# Patient Record
Sex: Male | Born: 1980 | Race: White | Hispanic: No | Marital: Single | Smoking: Former smoker
Health system: Southern US, Community
[De-identification: ages and names within clinical notes are randomized; demographics above are authoritative.]

## PROBLEM LIST (undated history)

## (undated) DIAGNOSIS — K219 Gastro-esophageal reflux disease without esophagitis: Secondary | ICD-10-CM

## (undated) DIAGNOSIS — K279 Peptic ulcer, site unspecified, unspecified as acute or chronic, without hemorrhage or perforation: Secondary | ICD-10-CM

## (undated) DIAGNOSIS — S62619A Displaced fracture of proximal phalanx of unspecified finger, initial encounter for closed fracture: Secondary | ICD-10-CM

## (undated) HISTORY — PX: UPPER GASTROINTESTINAL ENDOSCOPY: SHX188

---

## 2003-11-25 ENCOUNTER — Emergency Department (HOSPITAL_COMMUNITY): Admission: EM | Admit: 2003-11-25 | Discharge: 2003-11-25 | Payer: Self-pay | Admitting: Emergency Medicine

## 2006-03-13 ENCOUNTER — Ambulatory Visit (HOSPITAL_COMMUNITY): Admission: RE | Admit: 2006-03-13 | Discharge: 2006-03-13 | Payer: Self-pay | Admitting: Family Medicine

## 2006-07-15 ENCOUNTER — Emergency Department (HOSPITAL_COMMUNITY): Admission: EM | Admit: 2006-07-15 | Discharge: 2006-07-15 | Payer: Self-pay | Admitting: Emergency Medicine

## 2012-08-27 ENCOUNTER — Emergency Department (HOSPITAL_COMMUNITY)
Admission: EM | Admit: 2012-08-27 | Discharge: 2012-08-27 | Disposition: A | Discharge status: Home / Self Care | Payer: Self-pay | Attending: Emergency Medicine | Admitting: Emergency Medicine

## 2012-08-27 ENCOUNTER — Encounter (HOSPITAL_COMMUNITY): Payer: Self-pay | Admitting: *Deleted

## 2012-08-27 ENCOUNTER — Emergency Department (HOSPITAL_COMMUNITY): Payer: Self-pay

## 2012-08-27 DIAGNOSIS — R110 Nausea: Secondary | ICD-10-CM | POA: Insufficient documentation

## 2012-08-27 DIAGNOSIS — R109 Unspecified abdominal pain: Secondary | ICD-10-CM | POA: Insufficient documentation

## 2012-08-27 DIAGNOSIS — K5900 Constipation, unspecified: Secondary | ICD-10-CM | POA: Insufficient documentation

## 2012-08-27 DIAGNOSIS — R1030 Lower abdominal pain, unspecified: Secondary | ICD-10-CM

## 2012-08-27 DIAGNOSIS — R509 Fever, unspecified: Secondary | ICD-10-CM | POA: Insufficient documentation

## 2012-08-27 HISTORY — DX: Peptic ulcer, site unspecified, unspecified as acute or chronic, without hemorrhage or perforation: K27.9

## 2012-08-27 LAB — URINALYSIS, ROUTINE W REFLEX MICROSCOPIC
Bilirubin Urine: NEGATIVE
Glucose, UA: NEGATIVE mg/dL
Hgb urine dipstick: NEGATIVE
Ketones, ur: NEGATIVE mg/dL
Urobilinogen, UA: 0.2 mg/dL (ref 0.0–1.0)
pH: 6.5 (ref 5.0–8.0)

## 2012-08-27 LAB — CBC: WBC: 7.3 10*3/uL (ref 4.0–10.5)

## 2012-08-27 LAB — COMPREHENSIVE METABOLIC PANEL
Calcium: 9.3 mg/dL (ref 8.4–10.5)
Chloride: 100 mEq/L (ref 96–112)
Creatinine, Ser: 1.12 mg/dL (ref 0.50–1.35)
Sodium: 138 mEq/L (ref 135–145)
Total Bilirubin: 0.3 mg/dL (ref 0.3–1.2)
Total Protein: 6.8 g/dL (ref 6.0–8.3)

## 2012-08-27 MED ORDER — MORPHINE SULFATE 4 MG/ML IJ SOLN
4.0000 mg | Freq: Once | INTRAMUSCULAR | Status: DC
  Filled 2012-08-27: qty 1

## 2012-08-27 MED ORDER — OXYCODONE-ACETAMINOPHEN 5-325 MG PO TABS: 1.0000 | ORAL_TABLET | Freq: Four times a day (QID) | ORAL | Status: AC | PRN

## 2012-08-27 MED ORDER — IOHEXOL 300 MG/ML  SOLN
100.0000 mL | Freq: Once | INTRAMUSCULAR | Status: AC | PRN
  Administered 2012-08-27: 100 mL via INTRAVENOUS

## 2012-08-27 NOTE — ED Provider Notes (Signed)
History   This chart was scribed for Alejandro Chad, MD by [name]. The patient was seen in room [facility]. Patient's care was started at 1425.  CSN: [id number]  Arrival date & time 08/27/12  1425   First MD Initiated Contact with Patient 08/27/12 1638      Chief Complaint  Patient presents with  . Groin Pain   Patient is a 31 y.o. male presenting with groin pain. The history is provided by the patient. No language interpreter was used.  Groin Pain This is a new problem. The current episode started more than 1 week ago. The problem occurs constantly. The problem has not changed since onset.Pertinent negatives include no shortness of breath. The treatment provided no relief.    Alejandro Lawrence is a 31 y.o. male who presents to the Emergency Department complaining of 2 months of chronic groin pain, the result of injury sustained 06/24/12. Injury occured while lifting heavy beams. Pt has been evaluated by groin specialist and diagnosed with a pulled groin. Pain is moderate, worsening, and constant; radiating from groin to coccyx are. Pt endorses associate mild fever, light headedness, mild chills, light constipation, and left testicular "irresponsiveness." Pt denies penile discharge, dysuria, testicular pain, cough, and SOB.  Pt lists groin specialist Dr. Malvin Johns   Past Medical History  Diagnosis Date  . Peptic ulcer     Past Surgical History  Procedure Date  . Upper gastrointestinal endoscopy     History reviewed. No pertinent family history.  History  Substance Use Topics  . Smoking status: Current Some Day Smoker  . Smokeless tobacco: Not on file  . Alcohol Use: Yes   Review of Systems  Constitutional: Positive for fever (mild).  Respiratory: Negative for cough and shortness of breath.   Gastrointestinal: Positive for nausea and constipation (mild). Negative for vomiting.  Genitourinary: Negative for dysuria, hematuria, penile swelling, penile pain and testicular  pain.       +Groin pain  Neurological: Negative for light-headedness.    Allergies  Review of patient's allergies indicates no known allergies.  Home Medications   Current Outpatient Rx  Name Route Sig Dispense Refill  . SULFAMETHOXAZOLE-TMP DS 800-160 MG PO TABS Oral Take 1 tablet by mouth 2 (two) times daily. Started 08/24/12 for 10 days      BP 114/72  Pulse 89  Temp 98.2 F (36.8 C) (Oral)  Resp 18  Ht 5\' 7"  (1.702 m)  Wt 150 lb (68.04 kg)  BMI 23.49 kg/m2  SpO2 99%  Physical Exam  Nursing note and vitals reviewed. Constitutional: He is oriented to person, place, and time. He appears well-developed and well-nourished. No distress.  HENT:  Head: Normocephalic and atraumatic.  Right Ear: External ear normal.  Left Ear: External ear normal.  Nose: Nose normal.  Mouth/Throat: Oropharynx is clear and moist. No oropharyngeal exudate.  Eyes: Conjunctivae normal and EOM are normal. Pupils are equal, round, and reactive to light. Right eye exhibits no discharge. Left eye exhibits no discharge. No scleral icterus.  Neck: Normal range of motion. Neck supple. No JVD present. No tracheal deviation present.  Cardiovascular: Normal rate, regular rhythm, normal heart sounds and intact distal pulses.  Exam reveals no gallop and no friction rub.   No murmur heard. Pulmonary/Chest: Effort normal and breath sounds normal. No stridor. No respiratory distress. He has no wheezes. He has no rales. He exhibits no tenderness.  Abdominal: Soft. Bowel sounds are normal. He exhibits no shifting dullness, no distension, no  pulsatile liver, no fluid wave, no abdominal bruit, no ascites, no pulsatile midline mass and no mass. There is no hepatosplenomegaly, splenomegaly or hepatomegaly. There is tenderness. There is no rigidity, no rebound, no guarding, no CVA tenderness, no tenderness at McBurney's point and negative Murphy's sign. No hernia. Hernia confirmed negative in the ventral area, confirmed  negative in the right inguinal area and confirmed negative in the left inguinal area.    Genitourinary: Penis normal.    Cremasteric reflex is present. Right testis shows no mass, no swelling and no tenderness. Right testis is descended. Cremasteric reflex is not absent on the right side. Left testis shows no mass, no swelling and no tenderness. Left testis is descended. Cremasteric reflex is not absent on the left side. Uncircumcised. No phimosis, paraphimosis, penile erythema or penile tenderness. No discharge found.  Musculoskeletal: Normal range of motion. He exhibits no edema and no tenderness.  Lymphadenopathy:    He has no cervical adenopathy.       Right: No inguinal adenopathy present.       Left: No inguinal adenopathy present.  Neurological: He is alert and oriented to person, place, and time.  Skin: Skin is warm and dry. No rash noted. He is not diaphoretic. No erythema. No pallor.  Psychiatric: He has a normal mood and affect. His behavior is normal.    ED Course  Procedures (including critical care time) DIAGNOSTIC STUDIES: Oxygen Saturation is 100% on room air, normal by my interpretation.    COORDINATION OF CARE: 16:55- Evaluated Pt. Pt is awake,alert, oriented, and without distress.    Labs Reviewed  COMPREHENSIVE METABOLIC PANEL - Abnormal; Notable for the following:    Glucose, Bld 105 (*)     GFR calc non Af Amer 87 (*)     All other components within normal limits  URINALYSIS, ROUTINE W REFLEX MICROSCOPIC - Abnormal; Notable for the following:    Specific Gravity, Urine <1.005 (*)     All other components within normal limits  CBC   Ct Abdomen Pelvis W Contrast  08/27/2012  *RADIOLOGY REPORT*  Clinical Data: 31 year old male with left-sided abdominal, pelvic and inguinal pain.  CT ABDOMEN AND PELVIS WITH CONTRAST  Technique:  Multidetector CT imaging of the abdomen and pelvis was performed following the standard protocol during bolus administration of  intravenous contrast.  Contrast: OMNIPAQUE IOHEXOL 300 MG/ML  SOLN  Comparison: None  Findings: The liver, spleen, adrenal glands, kidneys, gallbladder and pancreas are unremarkable.  No free fluid, enlarged lymph nodes, biliary dilation or abdominal aortic aneurysm identified. The bowel and bladder are unremarkable. There is the suggestion of a very small left inguinal hernia containing fat. There is no evidence of right inguinal hernia.  There is no evidence of femoral or ventral hernia.  No acute or suspicious bony abnormalities are identified.  IMPRESSION: Possible very small left inguinal hernia containing fat - otherwise unremarkable CT of the abdomen and pelvis with contrast.   Original Report Authenticated By: Rosendo Gros, M.D.      No diagnosis found.    MDM  Pt presents for evaluation of pain along his left inguinal crease.  He appears nontoxic, NAD.  There is reproducible tenderness but no mass or palpable hernia.  This is a lingering injury since 7/11 that did not resolve after 3 wks rest and antiinflammatories.  He also reports a sluggish cremasteric reflex on the left.  Will obtain basic labs, u/a, and a CT scan to evaluate for a  possible sliding hernia or incarcerated hernia.  Pt has no testicular tenderness or masses.  No evidence of epididymitis on exam.  2100.  Pt stable, NAD.  CT scan negative for an incarcerated hernia but a "possible" small fat containing hernia is noted.  Note nl U/A and belly labs.  Plan follow-up closely with a general surgeon.  Discussed indications for immediate return to the emergency department.      I personally performed the services described in this documentation, which was scribed in my presence. The recorded information has been reviewed and considered.       Alejandro Chad, MD 08/27/12 2138

## 2012-08-27 NOTE — ED Notes (Signed)
Pain lt side of groin , being treated for Urgent Care. And seen by surgeon.2 days ago.

## 2012-08-27 NOTE — ED Notes (Signed)
Pt back from CT

## 2012-08-27 NOTE — ED Notes (Signed)
Pt reporting overall pain level tolerable, still not wanting pain medication.  Denies nausea or vomiting.  IV patent.  Pt denies needs at present time.

## 2012-08-31 NOTE — Consult Note (Signed)
[name], PENDRY NO.:  [id number]  MEDICAL RECORD NO.:  [id number]  LOCATION:  [facility]                         FACILITY:  [facility]  PHYSICIAN:  Barbaraann Barthel, M.D. DATE OF BIRTH:  03-28-1981  DATE OF CONSULTATION:  08/31/2012 DATE OF DISCHARGE:  08/27/2012                                CONSULTATION   DIAGNOSIS:  Left inguinal hernia.  NOTE:  This is a 31 year old white male who was sent to me by Dr. Wende Crease for left groin pain.  I initially thought that this was a groin strain as I clinically did not find a hernia on him.  I did have arrangements to follow up with him to make sure that I had not missed anything on my clinical examination.  Apparently in the mean time, he still had some discomfort in this area and went to the emergency room where CT scan revealed what was possibly a very small left inguinal hernia containing fat.  I reviewed the CT scans with Dr. Tyron Russell.  We did not find any evidence of any bowel within this.  However this could be a piece of fat that is causing him his discomfort through a small hernia site in his left groin.  We made plans for elective evaluation and surgery and repair of groin after exploration of left groin.  We discussed complications not limited to, but including bleeding, infection, and recurrence.  Informed consent was obtained.  Past medical history is included a history of peptic ulcer disease and reflux.  He had an upper GI endoscopy which in the past for this problem.  He had been taking some Advil and some Bactrim.  I advised him to not take Advil with his past history of ulcer disease.  He took Bactrim for dysuria and some epididymitis which he had in the past.  He has no known allergies.  He does not smoke or drink.  PHYSICAL EXAMINATION:  VITAL SIGNS:  He is 5 feet 7 inches, weighs 150 pounds, his temperature is 98.5 pulse rate is 80, respirations 12, blood pressure 110/80. HEENT:  Head is normocephalic.   Eyes, extraocular movements are intact. Pupils were round and reactive to light and accommodation.  There is no conjunctive pallor or scleral injection.  The sclera is a normal tincture.  Oral and nasal mucosa is moist. NECK:  Supple and cylindrical without jugular vein distention, thyromegaly or tracheal deviation.  No bruits are appreciated and there is no cervical adenopathy and no thyromegaly. CHEST:  Clear both the anterior and posterior auscultation. HEART:  Regular rhythm. ABDOMEN:  The patient has pain in his left groin, inguinal canal. Again, I do not palpate any real masses in this area, although there is a certain area of tenderness that is found on physical exam. RECTAL:  The stool is guaiac-negative. EXTREMITIES:  Within normal limits, however the patient has had a past history of a fracture to his left arm and he has also had a left shoulder dislocation in the past and he has also had a left ankle fracture in the past.  All of these on the left side as well as is left inguinal hernia.  REVIEW OF  SYSTEMS:  NEURO:  No lateralizing neurological findings.  No history of migraines or seizures.  ENDOCRINE:  No history of diabetes or thyroid disease.  CARDIOPULMONARY  Grossly within normal limits. MUSCULOSKELETAL:  Previous history of fractures of his left upper extremity and left lower extremity as mentioned above.  GI:  No history of nausea or vomiting.  No history of hepatitis, constipation, diarrhea or bright red rectal bleeding.  He did have some bright red rectal bleeding in the past when he was treated for his stomach ulcers.  No history of melena.  No history of inflammatory bowel disease or unexplained weight loss.  GU:  No history of kidney stones.  He did have some testicular discomfort.  He was treated for epididymitis recently. He has no dysuria and no frequency.  Review of history and physical, therefore, Alejandro Lawrence is a 31 year old male who was thought to have a  groin strain, but apparently on CT scan has a left inguinal hernia.  We will plan to explore this via the outpatient department.  We planned for elective outpatient surgery.     Barbaraann Barthel, M.D.     WB/MEDQ  D:  08/31/2012  T:  08/31/2012  Job:  [id number]  cc:   Laverle Hobby, M.D. [address] [place], [place] [address]

## 2012-09-01 ENCOUNTER — Encounter (HOSPITAL_COMMUNITY): Admission: RE | Admit: 2012-09-01 | Discharge: 2012-09-01 | Payer: Worker's Compensation | Source: Ambulatory Visit

## 2012-09-01 NOTE — Patient Instructions (Addendum)
21 Alejandro Lawrence  09/01/2012   Your procedure is scheduled on:  09/06/12  Report to [facility] at 06:15 AM.  Call this number if you have problems the morning of surgery: [phone]   Remember:   Do not eat food or drink:After Midnight.  Clear liquids include soda, tea, black coffee, apple or grape juice, broth.  Take these medicines the morning of surgery with A SIP OF WATER: None   Do not wear jewelry, make-up or nail polish.  Do not wear lotions, powders, or perfumes.   Do not shave 48 hours prior to surgery. Men may shave face and neck.  Do not bring valuables to the hospital.  Contacts, dentures or bridgework may not be worn into surgery.  Leave suitcase in the car. After surgery it may be brought to your room.  For patients admitted to the hospital, checkout time is 11:00 AM the day of discharge.   Patients discharged the day of surgery will not be allowed to drive home.  Special Instructions: CHG Shower Shower 2 days before surgery and 1 day before surgery with Hibiclens.   Please read over the following fact sheets that you were given: Pain Booklet, MRSA Information, Surgical Site Infection Prevention, Anesthesia Post-op Instructions and Care and Recovery After Surgery    Hernia, Surgical Repair A hernia occurs when an internal organ pushes out through a weak spot in the belly (abdominal) wall muscles. Hernias commonly occur in the groin and around the navel. Hernias often can be pushed back into place (reduced). Most hernias tend to get worse over time. Problems occur when abdominal contents get stuck in the opening (incarcerated hernia). The blood supply gets cut off (strangulated hernia). This is an emergency and needs surgery. Otherwise, hernia repair can be an elective procedure. This means you can schedule this at your convenience when an emergency is not present. Because complications can occur, if you decide to repair the hernia, it is best to do it soon. When it becomes an  emergency procedure, there is increased risk of complications after surgery. CAUSES   Heavy lifting.   Obesity.   Prolonged coughing.   Straining to move your bowels.   Hernias can also occur through a cut (incision) by a surgeonafter an abdominal operation.  HOME CARE INSTRUCTIONS Before the repair:  Bed rest is not required. You may continue your normal activities, but avoid heavy lifting (more than 10 pounds) or straining. Cough gently. If you are a smoker, it is best to stop. Even the best hernia repair can break down with the continual strain of coughing.   Do not wear anything tight over your hernia. Do not try to keep it in with an outside bandage or truss. These can damage abdominal contents if they are trapped in the hernia sac.   Eat a normal diet. Avoid constipation. Straining over long periods of time to have a bowel movement will increase hernia size. It also can breakdown repairs. If you cannot do this with diet alone, laxatives or stool softeners may be used.  PRIOR TO SURGERY, SEEK IMMEDIATE MEDICAL CARE IF: You have problems (symptoms) of a trapped (incarcerated) hernia. Symptoms include:  An oral temperature above 102 F (38.9 C) develops, or as your caregiver suggests.   Increasing abdominal pain.   Feeling sick to your stomach(nausea) and vomiting.   You stop passing gas or stool.   The hernia is stuck outside the abdomen, looks discolored, feels hard, or is tender.  You have any changes in your bowel habits or in the hernia that is unusual for you.  LET YOUR CAREGIVERS KNOW ABOUT THE FOLLOWING:  Allergies.   Medications taken including herbs, eye drops, over the counter medications, and creams.   Use of steroids (by mouth or creams).   Family or personal history of problems with anesthetics or Novocaine.   Possibility of pregnancy, if this applies.   Personal history of blood clots (thrombophlebitis).   Family or personal history of bleeding or  blood problems.   Previous surgery.   Other health problems.  BEFORE THE PROCEDURE You should be present 1 hour prior to your procedure, or as directed by your caregiver.  AFTER THE PROCEDURE After surgery, you will be taken to the recovery area. A nurse will watch and check your progress there. Once you are awake, stable, and taking fluids well, you will be allowed to go home as long as there are no problems. Once home, an ice pack (wrapped in a light towel) applied to your operative site may help with discomfort. It may also keep the swelling down. Do not lift anything heavier than 10 pounds (4.55 kilograms). Take showers not baths. Do not drive while taking narcotics. Follow instructions as suggested by your caregiver.  SEEK IMMEDIATE MEDICAL CARE IF: After surgery:  There is redness, swelling, or increasing pain in the wound.   There is pus coming from the wound.   There is drainage from a wound lasting longer than 1 day.   An unexplained oral temperature above 102 F (38.9 C) develops.   You notice a foul smell coming from the wound or dressing.   There is a breaking open of a wound (edged not staying together) after the sutures have been removed.   You notice increasing pain in the shoulders (shoulder strap areas).   You develop dizzy episodes or fainting while standing.   You develop persistent nausea or vomiting.   You develop a rash.   You have difficulty breathing.   You develop any reaction or side effects to medications given.  MAKE SURE YOU:   Understand these instructions.   Will watch your condition.   Will get help right away if you are not doing well or get worse.  Document Released: 05/27/2001 Document Revised: 11/20/2011 Document Reviewed: 04/18/2008 [facility] Patient Information 2012 [place], [place].   PATIENT INSTRUCTIONS POST-ANESTHESIA  IMMEDIATELY FOLLOWING SURGERY:  Do not drive or operate machinery for the first twenty four hours after  surgery.  Do not make any important decisions for twenty four hours after surgery or while taking narcotic pain medications or sedatives.  If you develop intractable nausea and vomiting or a severe headache please notify your doctor immediately.  FOLLOW-UP:  Please make an appointment with your surgeon as instructed. You do not need to follow up with anesthesia unless specifically instructed to do so.  WOUND CARE INSTRUCTIONS (if applicable):  Keep a dry clean dressing on the anesthesia/puncture wound site if there is drainage.  Once the wound has quit draining you may leave it open to air.  Generally you should leave the bandage intact for twenty four hours unless there is drainage.  If the epidural site drains for more than 36-48 hours please call the anesthesia department.  QUESTIONS?:  Please feel free to call your physician or the hospital operator if you have any questions, and they will be happy to assist you.

## 2012-09-02 NOTE — Patient Instructions (Addendum)
20 JAMARIOUS FEBO  09/02/2012   Your procedure is scheduled on:  09/06/2012  Report to [facility] at  615  AM.  Call this number if you have problems the morning of surgery: [phone]   Remember:   Do not eat food:After Midnight.  May have clear liquids:until Midnight .    Take these medicines the morning of surgery with A SIP OF WATER: oxycodone   Do not wear jewelry, make-up or nail polish.   Do not shave 48 hours prior to surgery. Men may shave face and neck.  Do not bring valuables to the hospital.  Contacts, dentures or bridgework may not be worn into surgery.  Leave suitcase in the car. After surgery it may be brought to your room.  For patients admitted to the hospital, checkout time is 11:00 AM the day of discharge.   Patients discharged the day of surgery will not be allowed to drive home.  Name and phone number of your driver: family  Special Instructions: Shower using CHG 2 nights before surgery and the night before surgery.  If you shower the day of surgery use CHG.  Use special wash - you have one bottle of CHG for all showers.  You should use approximately 1/3 of the bottle for each shower.   Please read over the following fact sheets that you were given: Pain Booklet, MRSA Information, Surgical Site Infection Prevention, Anesthesia Post-op Instructions and Care and Recovery After Surgery Inguinal Hernia, Adult Muscles help keep everything in the body in its proper place. But if a weak spot in the muscles develops, something can poke through. That is called a hernia. When this happens in the lower part of the belly (abdomen), it is called an inguinal hernia. (It takes its name from a part of the body in this region called the inguinal canal.) A weak spot in the wall of muscles lets some fat or part of the small intestine bulge through. An inguinal hernia can develop at any age. Men get them more often than women. CAUSES  In adults, an inguinal hernia develops over  time.  It can be triggered by:   Suddenly straining the muscles of the lower abdomen.   Lifting heavy objects.   Straining to have a bowel movement. Difficult bowel movements (constipation) can lead to this.   Constant coughing. This may be caused by smoking or lung disease.   Being overweight.   Being pregnant.   Working at a job that requires long periods of standing or heavy lifting.   Having had an inguinal hernia before.  One type can be an emergency situation. It is called a strangulated inguinal hernia. It develops if part of the small intestine slips through the weak spot and cannot get back into the abdomen. The blood supply can be cut off. If that happens, part of the intestine may die. This situation requires emergency surgery. SYMPTOMS  Often, a small inguinal hernia has no symptoms. It is found when a healthcare provider does a physical exam. Larger hernias usually have symptoms.   In adults, symptoms may include:   A lump in the groin. This is easier to see when the person is standing. It might disappear when lying down.   In men, a lump in the scrotum.   Pain or burning in the groin. This occurs especially when lifting, straining or coughing.   A dull ache or feeling of pressure in the groin.   Signs of  a strangulated hernia can include:   A bulge in the groin that becomes very painful and tender to the touch.   A bulge that turns red or purple.   Fever, nausea and vomiting.   Inability to have a bowel movement or to pass gas.  DIAGNOSIS  To decide if you have an inguinal hernia, a healthcare provider will probably do a physical examination.  This will include asking questions about any symptoms you have noticed.   The healthcare provider might feel the groin area and ask you to cough. If an inguinal hernia is felt, the healthcare provider may try to slide it back into the abdomen.   Usually no other tests are needed.  TREATMENT  Treatments can vary.  The size of the hernia makes a difference. Options include:  Watchful waiting. This is often suggested if the hernia is small and you have had no symptoms.   No medical procedure will be done unless symptoms develop.   You will need to watch closely for symptoms. If any occur, contact your healthcare provider right away.   Surgery. This is used if the hernia is larger or you have symptoms.   Open surgery. This is usually an outpatient procedure (you will not stay overnight in a hospital). An cut (incision) is made through the skin in the groin. The hernia is put back inside the abdomen. The weak area in the muscles is then repaired by herniorrhaphy or hernioplasty. Herniorrhaphy: in this type of surgery, the weak muscles are sewn back together. Hernioplasty: a patch or mesh is used to close the weak area in the abdominal wall.   Laparoscopy. In this procedure, a surgeon makes small incisions. A thin tube with a tiny video camera (called a laparoscope) is put into the abdomen. The surgeon repairs the hernia with mesh by looking with the video camera and using two long instruments.  HOME CARE INSTRUCTIONS   After surgery to repair an inguinal hernia:   You will need to take pain medicine prescribed by your healthcare provider. Follow all directions carefully.   You will need to take care of the wound from the incision.   Your activity will be restricted for awhile. This will probably include no heavy lifting for several weeks. You also should not do anything too active for a few weeks. When you can return to work will depend on the type of job that you have.   During "watchful waiting" periods, you should:   Maintain a healthy weight.   Eat a diet high in fiber (fruits, vegetables and whole grains).   Drink plenty of fluids to avoid constipation. This means drinking enough water and other liquids to keep your urine clear or pale yellow.   Do not lift heavy objects.   Do not stand for  long periods of time.   Quit smoking. This should keep you from developing a frequent cough.  SEEK MEDICAL CARE IF:   A bulge develops in your groin area.   You feel pain, a burning sensation or pressure in the groin. This might be worse if you are lifting or straining.   You develop a fever of more than 100.5 F (38.1 C).  SEEK IMMEDIATE MEDICAL CARE IF:   Pain in the groin increases suddenly.   A bulge in the groin gets bigger suddenly and does not go down.   For men, there is sudden pain in the scrotum. Or, the size of the scrotum increases.   A  bulge in the groin area becomes red or purple and is painful to touch.   You have nausea or vomiting that does not go away.   You feel your heart beating much faster than normal.   You cannot have a bowel movement or pass gas.   You develop a fever of more than 102.0 F (38.9 C).  Document Released: 04/19/2009 Document Revised: 11/20/2011 Document Reviewed: 04/19/2009 [facility] Patient Information 2012 [place], [place].PATIENT INSTRUCTIONS POST-ANESTHESIA  IMMEDIATELY FOLLOWING SURGERY:  Do not drive or operate machinery for the first twenty four hours after surgery.  Do not make any important decisions for twenty four hours after surgery or while taking narcotic pain medications or sedatives.  If you develop intractable nausea and vomiting or a severe headache please notify your doctor immediately.  FOLLOW-UP:  Please make an appointment with your surgeon as instructed. You do not need to follow up with anesthesia unless specifically instructed to do so.  WOUND CARE INSTRUCTIONS (if applicable):  Keep a dry clean dressing on the anesthesia/puncture wound site if there is drainage.  Once the wound has quit draining you may leave it open to air.  Generally you should leave the bandage intact for twenty four hours unless there is drainage.  If the epidural site drains for more than 36-48 hours please call the anesthesia  department.  QUESTIONS?:  Please feel free to call your physician or the hospital operator if you have any questions, and they will be happy to assist you.

## 2012-09-03 ENCOUNTER — Encounter (HOSPITAL_COMMUNITY): Payer: Self-pay | Admitting: Pharmacy Technician

## 2012-09-03 ENCOUNTER — Encounter (HOSPITAL_COMMUNITY): Payer: Self-pay

## 2012-09-03 ENCOUNTER — Encounter (HOSPITAL_COMMUNITY)
Admission: RE | Admit: 2012-09-03 | Discharge: 2012-09-03 | Disposition: A | Discharge status: Home / Self Care | Payer: Worker's Compensation | Source: Ambulatory Visit | Attending: General Surgery | Admitting: General Surgery

## 2012-09-03 LAB — SURGICAL PCR SCREEN: Staphylococcus aureus: NEGATIVE

## 2012-09-06 ENCOUNTER — Ambulatory Visit (HOSPITAL_COMMUNITY): Payer: Worker's Compensation | Admitting: Anesthesiology

## 2012-09-06 ENCOUNTER — Encounter (HOSPITAL_COMMUNITY)
Admission: RE | Disposition: A | Discharge status: Home / Self Care | Payer: Self-pay | Source: Ambulatory Visit | Attending: General Surgery

## 2012-09-06 ENCOUNTER — Ambulatory Visit (HOSPITAL_COMMUNITY)
Admission: RE | Admit: 2012-09-06 | Discharge: 2012-09-07 | Disposition: A | Discharge status: Home / Self Care | Payer: Worker's Compensation | Source: Ambulatory Visit | Attending: General Surgery | Admitting: General Surgery

## 2012-09-06 ENCOUNTER — Encounter (HOSPITAL_COMMUNITY): Payer: Self-pay | Admitting: Anesthesiology

## 2012-09-06 ENCOUNTER — Encounter (HOSPITAL_COMMUNITY): Payer: Self-pay

## 2012-09-06 DIAGNOSIS — Z01812 Encounter for preprocedural laboratory examination: Secondary | ICD-10-CM | POA: Insufficient documentation

## 2012-09-06 DIAGNOSIS — Z01810 Encounter for preprocedural cardiovascular examination: Secondary | ICD-10-CM | POA: Insufficient documentation

## 2012-09-06 DIAGNOSIS — K4090 Unilateral inguinal hernia, without obstruction or gangrene, not specified as recurrent: Secondary | ICD-10-CM

## 2012-09-06 HISTORY — PX: INGUINAL HERNIA REPAIR: SHX194

## 2012-09-06 SURGERY — REPAIR, HERNIA, INGUINAL, ADULT
Anesthesia: General | Site: Abdomen | Laterality: Left | Wound class: Clean

## 2012-09-06 MED ORDER — CEFAZOLIN SODIUM-DEXTROSE 2-3 GM-% IV SOLR
INTRAVENOUS | Status: AC
  Filled 2012-09-06: qty 50

## 2012-09-06 MED ORDER — LIDOCAINE HCL 1 % IJ SOLN
INTRAMUSCULAR | Status: DC | PRN
  Administered 2012-09-06: 50 mg via INTRADERMAL

## 2012-09-06 MED ORDER — ROCURONIUM BROMIDE 100 MG/10ML IV SOLN
INTRAVENOUS | Status: DC | PRN
  Administered 2012-09-06: 30 mg via INTRAVENOUS
  Administered 2012-09-06: 10 mg via INTRAVENOUS

## 2012-09-06 MED ORDER — SODIUM CHLORIDE 0.9 % IR SOLN
Status: DC | PRN
  Administered 2012-09-06: 1000 mL

## 2012-09-06 MED ORDER — ONDANSETRON HCL 4 MG/2ML IJ SOLN: 4.0000 mg | Freq: Once | INTRAMUSCULAR | Status: DC | PRN

## 2012-09-06 MED ORDER — NEOSTIGMINE METHYLSULFATE 1 MG/ML IJ SOLN
INTRAMUSCULAR | Status: AC
  Filled 2012-09-06: qty 10

## 2012-09-06 MED ORDER — FENTANYL CITRATE 0.05 MG/ML IJ SOLN
INTRAMUSCULAR | Status: DC | PRN
  Administered 2012-09-06 (×2): 50 ug via INTRAVENOUS
  Administered 2012-09-06: 100 ug via INTRAVENOUS
  Administered 2012-09-06: 50 ug via INTRAVENOUS

## 2012-09-06 MED ORDER — CEFAZOLIN SODIUM-DEXTROSE 2-3 GM-% IV SOLR: 2.0000 g | INTRAVENOUS | Status: DC

## 2012-09-06 MED ORDER — MIDAZOLAM HCL 5 MG/5ML IJ SOLN
INTRAMUSCULAR | Status: DC | PRN
  Administered 2012-09-06: 2 mg via INTRAVENOUS

## 2012-09-06 MED ORDER — MIDAZOLAM HCL 2 MG/2ML IJ SOLN
INTRAMUSCULAR | Status: AC
  Filled 2012-09-06: qty 2

## 2012-09-06 MED ORDER — SUCCINYLCHOLINE CHLORIDE 20 MG/ML IJ SOLN
INTRAMUSCULAR | Status: DC | PRN
  Administered 2012-09-06: 150 mg via INTRAVENOUS

## 2012-09-06 MED ORDER — BACITRACIN 50000 UNITS IM SOLR
INTRAMUSCULAR | Status: AC
  Filled 2012-09-06: qty 1

## 2012-09-06 MED ORDER — LORAZEPAM 0.5 MG PO TABS
0.5000 mg | ORAL_TABLET | Freq: Every day | ORAL | Status: DC
  Administered 2012-09-06: 0.5 mg via ORAL
  Filled 2012-09-06 (×2): qty 1

## 2012-09-06 MED ORDER — FENTANYL CITRATE 0.05 MG/ML IJ SOLN
INTRAMUSCULAR | Status: AC
  Filled 2012-09-06: qty 2

## 2012-09-06 MED ORDER — ONDANSETRON HCL 4 MG/2ML IJ SOLN
4.0000 mg | Freq: Four times a day (QID) | INTRAMUSCULAR | Status: DC | PRN
  Administered 2012-09-06 – 2012-09-07 (×2): 4 mg via INTRAVENOUS
  Filled 2012-09-06 (×3): qty 2

## 2012-09-06 MED ORDER — PROPOFOL 10 MG/ML IV BOLUS
INTRAVENOUS | Status: DC | PRN
  Administered 2012-09-06: 160 mg via INTRAVENOUS

## 2012-09-06 MED ORDER — GLYCOPYRROLATE 0.2 MG/ML IJ SOLN
INTRAMUSCULAR | Status: AC
  Filled 2012-09-06: qty 2

## 2012-09-06 MED ORDER — DOCUSATE SODIUM 100 MG PO CAPS
100.0000 mg | ORAL_CAPSULE | Freq: Every day | ORAL | Status: DC
  Administered 2012-09-06 – 2012-09-07 (×2): 100 mg via ORAL
  Filled 2012-09-06 (×2): qty 1

## 2012-09-06 MED ORDER — FAMOTIDINE IN NACL 20-0.9 MG/50ML-% IV SOLN
20.0000 mg | Freq: Two times a day (BID) | INTRAVENOUS | Status: DC
  Administered 2012-09-06 – 2012-09-07 (×3): 20 mg via INTRAVENOUS
  Filled 2012-09-06 (×5): qty 50

## 2012-09-06 MED ORDER — HYDROCODONE-ACETAMINOPHEN 5-325 MG PO TABS
1.0000 | ORAL_TABLET | ORAL | Status: DC | PRN
  Administered 2012-09-06 (×4): 1 via ORAL
  Administered 2012-09-07 (×2): 2 via ORAL
  Filled 2012-09-06: qty 2
  Filled 2012-09-06: qty 1
  Filled 2012-09-06: qty 2
  Filled 2012-09-06: qty 1
  Filled 2012-09-06: qty 2

## 2012-09-06 MED ORDER — GLYCOPYRROLATE 0.2 MG/ML IJ SOLN
INTRAMUSCULAR | Status: DC | PRN
  Administered 2012-09-06: 0.4 mg via INTRAVENOUS

## 2012-09-06 MED ORDER — POTASSIUM CHLORIDE IN NACL 20-0.9 MEQ/L-% IV SOLN
INTRAVENOUS | Status: DC
  Administered 2012-09-06 – 2012-09-07 (×2): via INTRAVENOUS
  Administered 2012-09-07: 1000 mL via INTRAVENOUS
  Filled 2012-09-06 (×4): qty 1000

## 2012-09-06 MED ORDER — CEFAZOLIN SODIUM-DEXTROSE 2-3 GM-% IV SOLR
INTRAVENOUS | Status: DC | PRN
  Administered 2012-09-06: 2 g via INTRAVENOUS

## 2012-09-06 MED ORDER — MIDAZOLAM HCL 2 MG/2ML IJ SOLN
1.0000 mg | INTRAMUSCULAR | Status: DC | PRN
  Administered 2012-09-06: 2 mg via INTRAVENOUS

## 2012-09-06 MED ORDER — MORPHINE SULFATE 4 MG/ML IJ SOLN
1.0000 mg | INTRAMUSCULAR | Status: DC | PRN
  Administered 2012-09-06: 1 mg via INTRAVENOUS
  Filled 2012-09-06: qty 1

## 2012-09-06 MED ORDER — NEOSTIGMINE METHYLSULFATE 1 MG/ML IJ SOLN
INTRAMUSCULAR | Status: DC | PRN
  Administered 2012-09-06: 3 mg via INTRAVENOUS

## 2012-09-06 MED ORDER — LIDOCAINE HCL (PF) 1 % IJ SOLN
INTRAMUSCULAR | Status: AC
  Filled 2012-09-06: qty 5

## 2012-09-06 MED ORDER — PROPOFOL 10 MG/ML IV EMUL
INTRAVENOUS | Status: AC
  Filled 2012-09-06: qty 20

## 2012-09-06 MED ORDER — BUPIVACAINE HCL (PF) 0.5 % IJ SOLN
INTRAMUSCULAR | Status: DC | PRN
  Administered 2012-09-06: 18 mL

## 2012-09-06 MED ORDER — FENTANYL CITRATE 0.05 MG/ML IJ SOLN
25.0000 ug | INTRAMUSCULAR | Status: DC | PRN
  Administered 2012-09-06 (×3): 50 ug via INTRAVENOUS

## 2012-09-06 MED ORDER — SUCCINYLCHOLINE CHLORIDE 20 MG/ML IJ SOLN
INTRAMUSCULAR | Status: AC
  Filled 2012-09-06: qty 1

## 2012-09-06 MED ORDER — ONDANSETRON HCL 4 MG PO TABS
4.0000 mg | ORAL_TABLET | Freq: Four times a day (QID) | ORAL | Status: DC | PRN
  Administered 2012-09-06: 4 mg via ORAL
  Filled 2012-09-06: qty 1

## 2012-09-06 MED ORDER — FENTANYL CITRATE 0.05 MG/ML IJ SOLN
INTRAMUSCULAR | Status: AC
  Filled 2012-09-06: qty 5

## 2012-09-06 MED ORDER — ONDANSETRON HCL 4 MG/2ML IJ SOLN
4.0000 mg | Freq: Once | INTRAMUSCULAR | Status: AC
  Administered 2012-09-06: 4 mg via INTRAVENOUS

## 2012-09-06 MED ORDER — ACETAMINOPHEN 650 MG RE SUPP: 650.0000 mg | RECTAL | Status: DC | PRN

## 2012-09-06 MED ORDER — ROCURONIUM BROMIDE 50 MG/5ML IV SOLN
INTRAVENOUS | Status: AC
  Filled 2012-09-06: qty 1

## 2012-09-06 MED ORDER — LACTATED RINGERS IV SOLN
INTRAVENOUS | Status: DC
  Administered 2012-09-06: 1000 mL via INTRAVENOUS
  Administered 2012-09-06: 07:00:00 via INTRAVENOUS

## 2012-09-06 MED ORDER — BUPIVACAINE HCL (PF) 0.5 % IJ SOLN
INTRAMUSCULAR | Status: AC
  Filled 2012-09-06: qty 30

## 2012-09-06 MED ORDER — ONDANSETRON HCL 4 MG/2ML IJ SOLN
INTRAMUSCULAR | Status: AC
  Filled 2012-09-06: qty 2

## 2012-09-06 SURGICAL SUPPLY — 47 items
ATTRACTOMAT 16X20 MAGNETIC DRP (DRAPES) ×2 IMPLANT
BAG HAMPER (MISCELLANEOUS) ×2 IMPLANT
CLOTH BEACON ORANGE TIMEOUT ST (SAFETY) ×2 IMPLANT
COVER LIGHT HANDLE STERIS (MISCELLANEOUS) ×4 IMPLANT
DECANTER SPIKE VIAL GLASS SM (MISCELLANEOUS) ×2 IMPLANT
DRAIN PENROSE 12X.25 LTX STRL (MISCELLANEOUS) ×2 IMPLANT
DRSG MEPILEX BORDER 4X8 (GAUZE/BANDAGES/DRESSINGS) IMPLANT
ELECT REM PT RETURN 9FT ADLT (ELECTROSURGICAL) ×2
ELECTRODE REM PT RTRN 9FT ADLT (ELECTROSURGICAL) ×1 IMPLANT
FORMALIN 10 PREFIL 120ML (MISCELLANEOUS) ×2 IMPLANT
GLOVE BIOGEL PI IND STRL 7.0 (GLOVE) IMPLANT
GLOVE BIOGEL PI INDICATOR 7.0 (GLOVE) ×2
GLOVE EXAM NITRILE MD LF STRL (GLOVE) ×1 IMPLANT
GLOVE SKINSENSE NS SZ7.0 (GLOVE) ×1
GLOVE SKINSENSE STRL SZ7.0 (GLOVE) ×1 IMPLANT
GLOVE SS BIOGEL STRL SZ 6.5 (GLOVE) IMPLANT
GLOVE SUPERSENSE BIOGEL SZ 6.5 (GLOVE) ×1
GOWN STRL REIN XL XLG (GOWN DISPOSABLE) ×6 IMPLANT
INST SET MINOR GENERAL (KITS) ×2 IMPLANT
KIT ROOM TURNOVER APOR (KITS) ×2 IMPLANT
MANIFOLD NEPTUNE II (INSTRUMENTS) ×2 IMPLANT
NDL HYPO 25X1 1.5 SAFETY (NEEDLE) ×1 IMPLANT
NEEDLE HYPO 25X1 1.5 SAFETY (NEEDLE) ×2 IMPLANT
NS IRRIG 1000ML POUR BTL (IV SOLUTION) ×2 IMPLANT
PACK MINOR (CUSTOM PROCEDURE TRAY) ×2 IMPLANT
PAD ARMBOARD 7.5X6 YLW CONV (MISCELLANEOUS) ×2 IMPLANT
SET BASIN LINEN APH (SET/KITS/TRAYS/PACK) ×2 IMPLANT
SOL PREP PROV IODINE SCRUB 4OZ (MISCELLANEOUS) ×2 IMPLANT
SPONGE GAUZE 4X4 12PLY (GAUZE/BANDAGES/DRESSINGS) ×2 IMPLANT
SPONGE INTESTINAL PEANUT (DISPOSABLE) ×2 IMPLANT
SPONGE LAP 18X18 X RAY DECT (DISPOSABLE) ×2 IMPLANT
STAPLER VISISTAT 35W (STAPLE) ×2 IMPLANT
SUT NOVA NAB GS-22 2 2-0 T-19 (SUTURE) IMPLANT
SUT NUROLON NAB CT 2 2-0 18IN (SUTURE) ×1 IMPLANT
SUT PROLENE 0 CT 1 CR/8 (SUTURE) IMPLANT
SUT SILK 2 0 (SUTURE) ×2
SUT SILK 2-0 18XBRD TIE 12 (SUTURE) ×1 IMPLANT
SUT VIC AB 0 CT1 27 (SUTURE) ×2
SUT VIC AB 0 CT1 27XBRD ANTBC (SUTURE) ×1 IMPLANT
SUT VIC AB 3-0 SH 27 (SUTURE) ×2
SUT VIC AB 3-0 SH 27X BRD (SUTURE) ×1 IMPLANT
SUT VICRYL AB 3 0 TIES (SUTURE) ×2 IMPLANT
SYR BULB IRRIGATION 50ML (SYRINGE) ×2 IMPLANT
SYR CONTROL 10ML LL (SYRINGE) ×2 IMPLANT
TAPE CLOTH SURG 4X10 WHT LF (GAUZE/BANDAGES/DRESSINGS) ×1 IMPLANT
TRAY FOLEY CATH 14FR (SET/KITS/TRAYS/PACK) ×2 IMPLANT
WATER STERILE IRR 1000ML POUR (IV SOLUTION) ×4 IMPLANT

## 2012-09-06 NOTE — Progress Notes (Signed)
UR Chart Review Completed-- OIB 

## 2012-09-06 NOTE — Progress Notes (Signed)
Pt c/o nausea intermittently, gave pt cotton ball with lavender oil to help with nausea.  Pt agreed to use aromatherapy to try and help his nausea.

## 2012-09-06 NOTE — Op Note (Signed)
Alejandro Lawrence, Alejandro Lawrence NO.:  [id number]  MEDICAL RECORD NO.:  [id number]  LOCATION:  A211                          FACILITY:  [facility]  PHYSICIAN:  Barbaraann Barthel, M.D. DATE OF BIRTH:  [birth year]  DATE OF PROCEDURE:  09/06/2012 DATE OF DISCHARGE:                              OPERATIVE REPORT   PREOPERATIVE DIAGNOSIS:  Left inguinal hernia.  POSTOPERATIVE DIAGNOSIS:  Left inguinal hernia.  PROCEDURE:  Left inguinal herniorrhaphy (modified McVay repair without mesh).  SPECIMENS:  Lipoma of the cord (properitoneal fat).  WOUND CLASSIFICATION:  Clean.  NOTE:  This is a 31 year old white male who in July strained while lifting heavy objects at work and had a severe left groin pain.  He was seen by Dr. Wende Crease and then later by me and we both thought that he had a left inguinal groin strain.  He also had some symptoms of epididymitis as well during that time and he was treated for both of these.  However, he continued to have pain.  He was seen in the emergency room where a CT scan showed that he had a small left inguinal hernia.  He came back to my office.  Then we scheduled surgery for him discussing complications not limited to, but including bleeding, infection, and recurrence. Informed consent was obtained.  We also discussed the possibility that mesh might be used in the surgery.  GROSS OPERATIVE FINDINGS:  The patient had a large lipoma of the cord. He had a direct defect that showed some looseness which revealed some laxity of the inguinal canal consistent with a small direct inguinal hernia.  He did not have any indirect component.  There was no inguinal hernia sac ligated.  The rest of the exploration was normal.  I appreciated that ilioinguinal nerve that was kept out of the way of dissection and there were no other abnormalities encountered.  TECHNIQUE:  The patient was placed in a supine position and after the adequate administration of general  anesthesia via endotracheal intubation, a Foley catheter was placed aseptically and then after prepped with Betadine solution and draping in the usual manner an incision was carried out between the anterior-superior iliac spine and the pubic tubercle through skin, subcutaneous tissue, down through Scarpa layer, which was opened.  Cord structures were dissected free from large lipomatous cord fat.  This was then ligated with 2-0 silk and the direct component of the hernia was then sutured closing transversus abdominis and transversalis fascia to Cooper ligament and Poupart ligament using 2-0 Bralon sutures.  Relaxing incision was carried out and these sutures were cinched tightly.  We carefully avoided any damage to the ilioinguinal nerve which was retracted out of the way and then we returned the cord structures and the nerve to their anatomic position and irrigated with normal saline solution and closed the external oblique using 3-0 Vicryl.  Wound was then irrigated with normal saline solution.  It should be stated I used approximately 18 mL of 0.5% Sensorcaine to help with postoperative discomfort.  Prior to closure, all sponge, needle, and instrument counts were found to be correct.  Estimated blood loss was minimal.  The patient tolerated the  procedure well, was taken to recovery room in satisfactory condition.     Barbaraann Barthel, M.D.     WB/MEDQ  D:  09/06/2012  T:  09/06/2012  Job:  [id number]  cc:   Laverle Hobby, M.D. [address] [place], [place] [address]

## 2012-09-06 NOTE — Anesthesia Procedure Notes (Addendum)
Procedure Name: Intubation Date/Time: 09/06/2012 8:02 AM Performed by: Despina Hidden Pre-anesthesia Checklist: Emergency Drugs available, Suction available, Patient identified and Patient being monitored Patient Re-evaluated:Patient Re-evaluated prior to inductionOxygen Delivery Method: Circle system utilized Preoxygenation: Pre-oxygenation with 100% oxygen Intubation Type: IV induction, Cricoid Pressure applied and Rapid sequence Ventilation: Mask ventilation without difficulty Laryngoscope Size: Mac and 3 Grade View: Grade I Tube type: Oral Tube size: 8.0 mm Number of attempts: 1 Airway Equipment and Method: Stylet Placement Confirmation: ETT inserted through vocal cords under direct vision,  breath sounds checked- equal and bilateral and positive ETCO2 Secured at: 22 cm Tube secured with: Tape   Date/Time: 09/06/2012 8:02 AM Performed by: Joycelyn Man J Dental Injury: Teeth and Oropharynx as per pre-operative assessment

## 2012-09-06 NOTE — Transfer of Care (Signed)
Immediate Anesthesia Transfer of Care Note  Patient: Alejandro Lawrence  Procedure(s) Performed: Procedure(s) (LRB) with comments: HERNIA REPAIR INGUINAL ADULT (Left)  Patient Location: PACU  Anesthesia Type: General  Level of Consciousness: awake and patient cooperative  Airway & Oxygen Therapy: Patient Spontanous Breathing and Patient connected to face mask oxygen  Post-op Assessment: Report given to PACU RN, Post -op Vital signs reviewed and stable and Patient moving all extremities  Post vital signs: Reviewed and stable  Complications: No apparent anesthesia complications

## 2012-09-06 NOTE — Progress Notes (Signed)
Post OP Check  Wound clean and dry.  Voiding well had some initial burning post catheterization.  Considerable incisional pain and post anesthesia nausea aggravated by morphine.  Doing a little better now.  Filed Vitals:   09/06/12 1427  BP: 134/76  Pulse: 77  Temp: 98.4 F (36.9 C)  Resp: 22

## 2012-09-06 NOTE — Addendum Note (Signed)
Addendum  created 09/06/12 0955 by Despina Hidden, CRNA   Modules edited:Anesthesia Flowsheet, Anesthesia Medication Administration

## 2012-09-06 NOTE — Anesthesia Postprocedure Evaluation (Signed)
  Anesthesia Post-op Note  Patient: Alejandro Lawrence  Procedure(s) Performed: Procedure(s) (LRB) with comments: HERNIA REPAIR INGUINAL ADULT (Left)  Patient Location: PACU  Anesthesia Type: General  Level of Consciousness: awake, alert , oriented and patient cooperative  Airway and Oxygen Therapy: Patient Spontanous Breathing  Post-op Pain: 3 /10, mild  Post-op Assessment: Post-op Vital signs reviewed, Patient's Cardiovascular Status Stable, Respiratory Function Stable, Patent Airway, No signs of Nausea or vomiting and Pain level controlled  Post-op Vital Signs: Reviewed and stable  Complications: No apparent anesthesia complications

## 2012-09-06 NOTE — Addendum Note (Signed)
Addendum  created 09/06/12 0955 by Tennis Mckinnon J Talea Manges, CRNA   Modules edited:Anesthesia Flowsheet, Anesthesia Medication Administration    

## 2012-09-06 NOTE — Progress Notes (Signed)
31 yr old W. Male  With [facility] for elective repair.  Pt hurt while lifting at work in July thought initially to have a groin strain and treated for this without relief.  CT scan revealed small hernia.  Discussed treatment and risks and informed consent obtained.   Labs reviewed and surgical site marked.  No clinical change since H&P.  Dict. # [id number].

## 2012-09-06 NOTE — Anesthesia Preprocedure Evaluation (Signed)
Anesthesia Evaluation  Patient identified by MRN, date of birth, ID band Patient awake    Reviewed: Allergy & Precautions, H&P , NPO status , Patient's Chart, lab work & pertinent test results  Airway Mallampati: II TM Distance: >3 FB Neck ROM: Full    Dental  (+) Teeth Intact   Pulmonary Current Smoker,  breath sounds clear to auscultation- rhonchi        Cardiovascular negative cardio ROS  Rhythm:Regular Rate:Normal     Neuro/Psych PSYCHIATRIC DISORDERS Anxiety    GI/Hepatic PUD, GERD-  Medicated and Poorly Controlled,  Endo/Other    Renal/GU      Musculoskeletal   Abdominal   Peds  Hematology   Anesthesia Other Findings   Reproductive/Obstetrics                           Anesthesia Physical Anesthesia Plan  ASA: II  Anesthesia Plan:    Post-op Pain Management:    Induction: Intravenous, Rapid sequence and Cricoid pressure planned  Airway Management Planned: Oral ETT  Additional Equipment:   Intra-op Plan:   Post-operative Plan: Extubation in OR  Informed Consent: I have reviewed the patients History and Physical, chart, labs and discussed the procedure including the risks, benefits and alternatives for the proposed anesthesia with the patient or authorized representative who has indicated his/her understanding and acceptance.     Plan Discussed with:   Anesthesia Plan Comments:         Anesthesia Quick Evaluation

## 2012-09-06 NOTE — Brief Op Note (Signed)
09/06/2012  9:40 AM  PATIENT:  Alejandro Lawrence  31 y.o. male  PRE-OPERATIVE DIAGNOSIS:  left inguinal hernia  POST-OPERATIVE DIAGNOSIS:  left inguinal hernia  PROCEDURE:  Procedure(s) (LRB) with comments: HERNIA REPAIR INGUINAL ADULT (Left)  SURGEON:  Surgeon(s) and Role:    * Marlane Hatcher, MD - Primary  PHYSICIAN ASSISTANT:   ASSISTANTS: none   ANESTHESIA:   general  EBL:  Total I/O In: 925 [I.V.:925] Out: 300 [Urine:300]  BLOOD ADMINISTERED:none  DRAINS: none   LOCAL MEDICATIONS USED:  MARCAINE 0.5%  18CC   SPECIMEN:  Source of Specimen:  lipoma of the cord (properitoneal fat)   Left groin.  DISPOSITION OF SPECIMEN:  PATHOLOGY  COUNTS:  YES  TOURNIQUET:  * No tourniquets in log *  DICTATION: .Other Dictation: Dictation Number OR dictation # [id number].  PLAN OF CARE: Admit for overnight observation  PATIENT DISPOSITION:  PACU - hemodynamically stable.   Delay start of Pharmacological VTE agent (>24hrs) due to surgical blood loss or risk of bleeding: not applicable

## 2012-09-06 NOTE — Plan of Care (Signed)
Problem: Phase I Progression Outcomes Goal: Pain controlled with appropriate interventions Outcome: Progressing Pt continues to c/o pain with pain medication regimen.

## 2012-09-07 LAB — CBC
MCH: 29.8 pg (ref 26.0–34.0)
MCHC: 34.4 g/dL (ref 30.0–36.0)
Platelets: 323 10*3/uL (ref 150–400)

## 2012-09-07 NOTE — Progress Notes (Signed)
POD #1  Filed Vitals:   09/07/12 0513  BP: 128/94  Pulse: 97  Temp: 97.6 F (36.4 C)  Resp: 20   Pt feels a lot better with less pain but still has rather low thresh hold for pain.  He has pain meds and stool softeners at home and these were not ordered as discharge meds.  Wound is very clean and was redressed.  He is voiding with dysuria..No leg pain or shortness of breath or chest pain.  Discharge and follow up arranged.  Note: Pt is seeking legal help from this work related problem and although his CT scan without contrast was neg, he may require an MRI to R/O disc injury.  He has surgical staples now and this cannot be done at present.

## 2012-09-07 NOTE — Anesthesia Postprocedure Evaluation (Signed)
  Anesthesia Post-op Note  Patient: Alejandro Lawrence  Procedure(s) Performed: Procedure(s) (LRB) with comments: HERNIA REPAIR INGUINAL ADULT (Left)  Patient Location: room 213  Anesthesia Type: General  Level of Consciousness: awake, alert , oriented and patient cooperative  Airway and Oxygen Therapy: Patient Spontanous Breathing  Post-op Pain: 7 /10, moderate  Post-op Assessment: Post-op Vital signs reviewed, Patient's Cardiovascular Status Stable, Respiratory Function Stable, Patent Airway, No signs of Nausea or vomiting, Adequate PO intake and Pain level controlled  Post-op Vital Signs: Reviewed and stable  Complications: No apparent anesthesia complications

## 2012-09-07 NOTE — Progress Notes (Signed)
Patient discharged with instructions given on medications,and follow up visits,patient verbalized understanding. No c/o pain or discomfort noted. Accompanied by staff to an awaiting vehicle.. 

## 2012-09-07 NOTE — Addendum Note (Signed)
Addendum  created 09/07/12 1257 by Despina Hidden, CRNA   Modules edited:Notes Section

## 2012-09-07 NOTE — Progress Notes (Signed)
Discharge dict. # [id number].

## 2012-09-07 NOTE — Discharge Summary (Signed)
NAMEWEYMAN, BOGDON NO.:  [id number]  MEDICAL RECORD NO.:  [id number]  LOCATION:  A211                          FACILITY:  [facility]  PHYSICIAN:  Barbaraann Barthel, M.D. DATE OF BIRTH:  1981-10-23  DATE OF ADMISSION:  09/06/2012 DATE OF DISCHARGE:  09/24/2013LH                              DISCHARGE SUMMARY   DIAGNOSIS:  Left inguinal hernia.  NOTE:  This a 31 year old white male who was seen by Dr. Wende Crease for an injury occurring at work where he had left groin pain.  I saw him as an outpatient after Dr. Wende Crease referred him to me.  In essence, I agreed with his preliminary diagnosis of a groin strain as I did not appreciate clinically a hernia here.  He continued to have discomfort.  He was seen in the emergency room where a CT scan was done and this revealed a small left inguinal hernia.  As he continued to have discomfort, we planned for elective surgery.  It should be noted that we did not see any evidence of a disk on a CT scan done without contrast, however he may well need an MRI without contrast in the lumbar area to completely rule this out as he is seeking further legal consultation.  I could not order this for him as he had surgical clips placed in the postoperative.  Clinically, the patient had a left inguinal hernia that appeared to have a direct defect.  This was repaired using a modified McVay repair without the use of prosthesis.  This was done uneventfully.  He was brought into the hospital.  He did have some postoperative discomfort, but this lessened over time.  He had some minimal dysuria which was catheter related as well disappeared after subsequent urinations.  He was discharged on the following day at which time he was ambulating with a lot less discomfort and voiding without pain.  He had no leg pain or shortness of breath or chest pain.  We explained wound care to him and this patient has plenty of pain meds, Percocet and stool softeners  at home and these were explained to him as well as wound care.  He is told to contact us or go to the emergency room should he have any acute changes.  We will follow up with him in 1 week's time.     Barbaraann Barthel, M.D.     WB/MEDQ  D:  09/07/2012  T:  09/07/2012  Job:  [id number]  cc:   Laverle Hobby, M.D. [address] [place], [place] [address]

## 2012-09-09 ENCOUNTER — Encounter (HOSPITAL_COMMUNITY): Payer: Self-pay | Admitting: General Surgery

## 2012-11-08 ENCOUNTER — Other Ambulatory Visit (HOSPITAL_COMMUNITY): Payer: Self-pay | Admitting: Urology

## 2012-11-08 DIAGNOSIS — N453 Epididymo-orchitis: Secondary | ICD-10-CM

## 2012-11-16 ENCOUNTER — Ambulatory Visit (HOSPITAL_COMMUNITY): Payer: Self-pay

## 2013-01-17 DIAGNOSIS — S62619A Displaced fracture of proximal phalanx of unspecified finger, initial encounter for closed fracture: Secondary | ICD-10-CM

## 2013-01-17 HISTORY — DX: Displaced fracture of proximal phalanx of unspecified finger, initial encounter for closed fracture: S62.619A

## 2013-01-18 ENCOUNTER — Emergency Department (HOSPITAL_COMMUNITY)
Admission: EM | Admit: 2013-01-18 | Discharge: 2013-01-18 | Disposition: A | Discharge status: Home / Self Care | Payer: Self-pay | Attending: Emergency Medicine | Admitting: Emergency Medicine

## 2013-01-18 ENCOUNTER — Encounter (HOSPITAL_COMMUNITY): Payer: Self-pay | Admitting: *Deleted

## 2013-01-18 ENCOUNTER — Emergency Department (HOSPITAL_COMMUNITY): Payer: Self-pay

## 2013-01-18 DIAGNOSIS — Z8711 Personal history of peptic ulcer disease: Secondary | ICD-10-CM | POA: Insufficient documentation

## 2013-01-18 DIAGNOSIS — W2209XA Striking against other stationary object, initial encounter: Secondary | ICD-10-CM | POA: Insufficient documentation

## 2013-01-18 DIAGNOSIS — S62509A Fracture of unspecified phalanx of unspecified thumb, initial encounter for closed fracture: Secondary | ICD-10-CM

## 2013-01-18 DIAGNOSIS — S62233A Other displaced fracture of base of first metacarpal bone, unspecified hand, initial encounter for closed fracture: Secondary | ICD-10-CM | POA: Insufficient documentation

## 2013-01-18 DIAGNOSIS — Y92009 Unspecified place in unspecified non-institutional (private) residence as the place of occurrence of the external cause: Secondary | ICD-10-CM | POA: Insufficient documentation

## 2013-01-18 DIAGNOSIS — Y9389 Activity, other specified: Secondary | ICD-10-CM | POA: Insufficient documentation

## 2013-01-18 MED ORDER — NAPROXEN 500 MG PO TABS: 500.0000 mg | ORAL_TABLET | Freq: Two times a day (BID) | ORAL | Status: AC

## 2013-01-18 MED ORDER — OXYCODONE-ACETAMINOPHEN 5-325 MG PO TABS: 1.0000 | ORAL_TABLET | ORAL | Status: AC | PRN

## 2013-01-18 MED ORDER — IBUPROFEN 800 MG PO TABS
800.0000 mg | ORAL_TABLET | Freq: Once | ORAL | Status: AC
  Administered 2013-01-18: 800 mg via ORAL
  Filled 2013-01-18: qty 1

## 2013-01-18 NOTE — ED Provider Notes (Signed)
History     CSN: [id number]  Arrival date & time 01/18/13  1157   First MD Initiated Contact with Patient 01/18/13 1311      Chief Complaint  Patient presents with  . Hand Injury    (Consider location/radiation/quality/duration/timing/severity/associated sxs/prior treatment) HPI Comments: Patient c/o pain and swelling to the right thumb that began after he punched a wall.  States the pain is worse with movement and improves with rest.  He has applied ice packs without relief.  He denies wrist pain, numbness or weakness of the digit.    Patient is a 32 y.o. male presenting with hand injury. The history is provided by the patient.  Hand Injury  The incident occurred yesterday. The incident occurred at home. The injury mechanism was a direct blow. Pain location: right thumb. The quality of the pain is described as aching and throbbing. The pain is moderate. The pain has been constant since the incident. Pertinent negatives include no fever and no malaise/fatigue. He reports no foreign bodies present. The symptoms are aggravated by use and movement. He has tried ice and elevation for the symptoms. The treatment provided no relief.    Past Medical History  Diagnosis Date  . Peptic ulcer   . History of stomach ulcers     Past Surgical History  Procedure Date  . Upper gastrointestinal endoscopy     age 35  . Inguinal hernia repair 09/06/2012    Procedure: HERNIA REPAIR INGUINAL ADULT;  Surgeon: Marlane Hatcher, MD;  Location: [facility];  Service: General;  Laterality: Left;    No family history on file.  History  Substance Use Topics  . Smoking status: Current Some Day Smoker -- 0.5 packs/day    Types: Cigarettes  . Smokeless tobacco: Not on file  . Alcohol Use: Yes     Comment: occassionally      Review of Systems  Constitutional: Negative for fever, chills and malaise/fatigue.  Genitourinary: Negative for dysuria and difficulty urinating.  Musculoskeletal: Positive for  joint swelling and arthralgias.  Skin: Negative for color change and wound.  All other systems reviewed and are negative.    Allergies  Review of patient's allergies indicates no known allergies.  Home Medications   Current Outpatient Rx  Name  Route  Sig  Dispense  Refill  . LANSOPRAZOLE 15 MG PO CPDR   Oral   Take 15 mg by mouth daily.           BP 126/80  Pulse 92  Temp 99 F (37.2 C) (Oral)  Resp 20  Ht 5\' 7"  (1.702 m)  Wt 150 lb (68.04 kg)  BMI 23.49 kg/m2  SpO2 99%  Physical Exam  Nursing note and vitals reviewed. Constitutional: He is oriented to person, place, and time. He appears well-developed and well-nourished. No distress.  HENT:  Head: Normocephalic and atraumatic.  Cardiovascular: Normal rate, regular rhythm and normal heart sounds.   Pulmonary/Chest: Effort normal and breath sounds normal.  Musculoskeletal: He exhibits edema and tenderness.       Right hand: He exhibits decreased range of motion, tenderness, bony tenderness and swelling. He exhibits normal two-point discrimination, normal capillary refill, no deformity and no laceration. normal sensation noted. Normal strength noted.       Hands:      ttp of the proximal right thumb, moderate STS also present.  Radial pulse is brisk, distal sensation intact.  CR< 2 sec.  ROM is limited due to level of pain  Neurological: He is alert and oriented to person, place, and time. He exhibits normal muscle tone. Coordination normal.  Skin: Skin is warm and dry.    ED Course  Procedures (including critical care time)  Labs Reviewed - No data to display Dg Finger Thumb Right  01/18/2013  *RADIOLOGY REPORT*  Clinical Data: Pain post trauma  RIGHT THUMB 2+V  Comparison: None.  Findings:  Frontal, oblique, and lateral views were obtained. There is a comminuted fracture of the proximal portion of the first proximal phalanx.  There are several displaced fracture fragments. There is mild impaction in the area of  fracture.  No other fractures.  No dislocation.  Joint spaces appear intact.  IMPRESSION: Comminuted fracture of proximal portion of first proximal phalanx.   Original Report Authenticated By: Bretta Bang, M.D.      Thumb spica splint applied, pain improved, remains NV intact.     MDM   Pt agrees to elevate and apply ice packs, referral given for Dr. Romeo Apple and Dr. Merlyn Lot.     Ibuprofen 800mg  given in the dept.    Prescribed: Percocet #20 naprosyn    Arrow Tomko L. [place], [place] 01/20/13 [phone]

## 2013-01-18 NOTE — ED Notes (Signed)
Pt states punched wall last night. C/O Rt thumb pain. Small amount swelling noted and deformity. Pulses distal to said injury intact and present with good cap refill.

## 2013-01-20 NOTE — ED Provider Notes (Signed)
Medical screening examination/treatment/procedure(s) were performed by non-physician practitioner and as supervising physician I was immediately available for consultation/collaboration.   Carleene Cooper III, MD 01/20/13 [phone]

## 2013-01-24 ENCOUNTER — Encounter (HOSPITAL_BASED_OUTPATIENT_CLINIC_OR_DEPARTMENT_OTHER): Payer: Self-pay | Admitting: *Deleted

## 2013-01-24 ENCOUNTER — Other Ambulatory Visit: Payer: Self-pay | Admitting: Orthopedic Surgery

## 2013-01-31 ENCOUNTER — Encounter (HOSPITAL_BASED_OUTPATIENT_CLINIC_OR_DEPARTMENT_OTHER)
Admission: RE | Disposition: A | Discharge status: Home / Self Care | Payer: Self-pay | Source: Ambulatory Visit | Attending: Orthopedic Surgery

## 2013-01-31 ENCOUNTER — Ambulatory Visit (HOSPITAL_BASED_OUTPATIENT_CLINIC_OR_DEPARTMENT_OTHER)
Admission: RE | Admit: 2013-01-31 | Discharge: 2013-01-31 | Disposition: A | Discharge status: Home / Self Care | Payer: Self-pay | Source: Ambulatory Visit | Attending: Orthopedic Surgery | Admitting: Orthopedic Surgery

## 2013-01-31 ENCOUNTER — Encounter (HOSPITAL_BASED_OUTPATIENT_CLINIC_OR_DEPARTMENT_OTHER): Payer: Self-pay | Admitting: *Deleted

## 2013-01-31 ENCOUNTER — Encounter (HOSPITAL_BASED_OUTPATIENT_CLINIC_OR_DEPARTMENT_OTHER): Payer: Self-pay | Admitting: Anesthesiology

## 2013-01-31 DIAGNOSIS — K219 Gastro-esophageal reflux disease without esophagitis: Secondary | ICD-10-CM | POA: Insufficient documentation

## 2013-01-31 DIAGNOSIS — IMO0002 Reserved for concepts with insufficient information to code with codable children: Secondary | ICD-10-CM | POA: Insufficient documentation

## 2013-01-31 HISTORY — DX: Gastro-esophageal reflux disease without esophagitis: K21.9

## 2013-01-31 HISTORY — DX: Displaced fracture of proximal phalanx of unspecified finger, initial encounter for closed fracture: S62.619A

## 2013-01-31 HISTORY — PX: CLOSED REDUCTION METACARPAL WITH PERCUTANEOUS PINNING: SHX5613

## 2013-01-31 SURGERY — CLOSED REDUCTION, FRACTURE, METACARPAL BONE, WITH PERCUTANEOUS PINNING
Anesthesia: General | Site: Thumb | Laterality: Right | Wound class: Clean

## 2013-01-31 MED ORDER — CHLORHEXIDINE GLUCONATE 4 % EX LIQD: 60.0000 mL | Freq: Once | CUTANEOUS | Status: DC

## 2013-01-31 MED ORDER — OXYCODONE-ACETAMINOPHEN 5-325 MG PO TABS: ORAL_TABLET | ORAL | Status: AC

## 2013-01-31 MED ORDER — ONDANSETRON HCL 4 MG/2ML IJ SOLN
INTRAMUSCULAR | Status: DC | PRN
  Administered 2013-01-31: 4 mg via INTRAVENOUS

## 2013-01-31 MED ORDER — HYDROMORPHONE HCL PF 1 MG/ML IJ SOLN
0.2500 mg | INTRAMUSCULAR | Status: DC | PRN
  Administered 2013-01-31 (×3): 0.5 mg via INTRAVENOUS

## 2013-01-31 MED ORDER — FENTANYL CITRATE 0.05 MG/ML IJ SOLN
INTRAMUSCULAR | Status: DC | PRN
  Administered 2013-01-31 (×2): 25 ug via INTRAVENOUS

## 2013-01-31 MED ORDER — DEXAMETHASONE SODIUM PHOSPHATE 4 MG/ML IJ SOLN
INTRAMUSCULAR | Status: DC | PRN
  Administered 2013-01-31: 10 mg via INTRAVENOUS

## 2013-01-31 MED ORDER — OXYCODONE HCL 5 MG/5ML PO SOLN: 5.0000 mg | Freq: Once | ORAL | Status: AC | PRN

## 2013-01-31 MED ORDER — MIDAZOLAM HCL 2 MG/2ML IJ SOLN: 1.0000 mg | INTRAMUSCULAR | Status: DC | PRN

## 2013-01-31 MED ORDER — OXYCODONE HCL 5 MG PO TABS
5.0000 mg | ORAL_TABLET | Freq: Once | ORAL | Status: AC | PRN
  Administered 2013-01-31: 5 mg via ORAL

## 2013-01-31 MED ORDER — LIDOCAINE HCL (CARDIAC) 20 MG/ML IV SOLN
INTRAVENOUS | Status: DC | PRN
  Administered 2013-01-31: 75 mg via INTRAVENOUS

## 2013-01-31 MED ORDER — MIDAZOLAM HCL 2 MG/ML PO SYRP: 12.0000 mg | ORAL_SOLUTION | Freq: Once | ORAL | Status: DC | PRN

## 2013-01-31 MED ORDER — CEFAZOLIN SODIUM-DEXTROSE 2-3 GM-% IV SOLR
2.0000 g | Freq: Once | INTRAVENOUS | Status: AC
  Administered 2013-01-31: 2 g via INTRAVENOUS

## 2013-01-31 MED ORDER — LACTATED RINGERS IV SOLN
INTRAVENOUS | Status: DC
  Administered 2013-01-31 (×2): via INTRAVENOUS

## 2013-01-31 MED ORDER — BUPIVACAINE HCL (PF) 0.25 % IJ SOLN
INTRAMUSCULAR | Status: DC | PRN
  Administered 2013-01-31: 9 mL via INTRA_ARTICULAR

## 2013-01-31 MED ORDER — MIDAZOLAM HCL 5 MG/5ML IJ SOLN
INTRAMUSCULAR | Status: DC | PRN
  Administered 2013-01-31: 2 mg via INTRAVENOUS

## 2013-01-31 MED ORDER — FENTANYL CITRATE 0.05 MG/ML IJ SOLN: 50.0000 ug | INTRAMUSCULAR | Status: DC | PRN

## 2013-01-31 MED ORDER — PROPOFOL 10 MG/ML IV BOLUS
INTRAVENOUS | Status: DC | PRN
  Administered 2013-01-31: 200 mg via INTRAVENOUS

## 2013-01-31 SURGICAL SUPPLY — 64 items
BANDAGE ELASTIC 3 VELCRO ST LF (GAUZE/BANDAGES/DRESSINGS) ×2 IMPLANT
BANDAGE GAUZE ELAST BULKY 4 IN (GAUZE/BANDAGES/DRESSINGS) ×2 IMPLANT
BLADE MINI RND TIP GREEN BEAV (BLADE) IMPLANT
BLADE SURG 15 STRL LF DISP TIS (BLADE) ×2 IMPLANT
BLADE SURG 15 STRL SS (BLADE) ×2
BNDG CMPR 9X4 STRL LF SNTH (GAUZE/BANDAGES/DRESSINGS) ×1
BNDG CMPR MD 5X2 ELC HKLP STRL (GAUZE/BANDAGES/DRESSINGS)
BNDG ELASTIC 2 VLCR STRL LF (GAUZE/BANDAGES/DRESSINGS) IMPLANT
BNDG ESMARK 4X9 LF (GAUZE/BANDAGES/DRESSINGS) ×2 IMPLANT
BRUSH SCRUB EZ PLAIN DRY (MISCELLANEOUS) ×1 IMPLANT
CHLORAPREP W/TINT 26ML (MISCELLANEOUS) ×2 IMPLANT
CLOTH BEACON ORANGE TIMEOUT ST (SAFETY) ×2 IMPLANT
CORDS BIPOLAR (ELECTRODE) ×1 IMPLANT
COVER MAYO STAND STRL (DRAPES) ×2 IMPLANT
COVER TABLE BACK 60X90 (DRAPES) ×2 IMPLANT
CUFF TOURNIQUET SINGLE 18IN (TOURNIQUET CUFF) ×2 IMPLANT
DRAPE EXTREMITY T 121X128X90 (DRAPE) ×2 IMPLANT
DRAPE OEC MINIVIEW 54X84 (DRAPES) ×2 IMPLANT
DRAPE SURG 17X23 STRL (DRAPES) ×2 IMPLANT
GAUZE XEROFORM 1X8 LF (GAUZE/BANDAGES/DRESSINGS) ×2 IMPLANT
GLOVE BIO SURGEON STRL SZ 6.5 (GLOVE) ×1 IMPLANT
GLOVE BIO SURGEON STRL SZ7.5 (GLOVE) ×2 IMPLANT
GLOVE BIOGEL PI IND STRL 7.0 (GLOVE) IMPLANT
GLOVE BIOGEL PI IND STRL 8 (GLOVE) ×1 IMPLANT
GLOVE BIOGEL PI IND STRL 8.5 (GLOVE) IMPLANT
GLOVE BIOGEL PI INDICATOR 7.0 (GLOVE) ×1
GLOVE BIOGEL PI INDICATOR 8 (GLOVE) ×1
GLOVE BIOGEL PI INDICATOR 8.5 (GLOVE)
GLOVE EXAM NITRILE EXT CUFF MD (GLOVE) ×1 IMPLANT
GLOVE SURG ORTHO 8.0 STRL STRW (GLOVE) IMPLANT
GOWN PREVENTION PLUS XLARGE (GOWN DISPOSABLE) ×2 IMPLANT
GOWN PREVENTION PLUS XXLARGE (GOWN DISPOSABLE) ×1 IMPLANT
GOWN STRL REIN XL XLG (GOWN DISPOSABLE) ×2 IMPLANT
K-WIRE .035X4 (WIRE) ×2 IMPLANT
NDL HYPO 25X1 1.5 SAFETY (NEEDLE) IMPLANT
NEEDLE HYPO 22GX1.5 SAFETY (NEEDLE) IMPLANT
NEEDLE HYPO 25X1 1.5 SAFETY (NEEDLE) ×2 IMPLANT
NS IRRIG 1000ML POUR BTL (IV SOLUTION) ×2 IMPLANT
PACK BASIN DAY SURGERY FS (CUSTOM PROCEDURE TRAY) ×2 IMPLANT
PAD CAST 3X4 CTTN HI CHSV (CAST SUPPLIES) ×1 IMPLANT
PAD CAST 4YDX4 CTTN HI CHSV (CAST SUPPLIES) IMPLANT
PADDING CAST ABS 4INX4YD NS (CAST SUPPLIES)
PADDING CAST ABS COTTON 4X4 ST (CAST SUPPLIES) ×1 IMPLANT
PADDING CAST COTTON 3X4 STRL (CAST SUPPLIES) ×2
PADDING CAST COTTON 4X4 STRL (CAST SUPPLIES)
SLEEVE SCD COMPRESS KNEE MED (MISCELLANEOUS) ×1 IMPLANT
SPLINT PLASTER CAST XFAST 3X15 (CAST SUPPLIES) IMPLANT
SPLINT PLASTER CAST XFAST 4X15 (CAST SUPPLIES) IMPLANT
SPLINT PLASTER XTRA FAST SET 4 (CAST SUPPLIES)
SPLINT PLASTER XTRA FASTSET 3X (CAST SUPPLIES) ×20
SPONGE GAUZE 4X4 12PLY (GAUZE/BANDAGES/DRESSINGS) ×2 IMPLANT
STOCKINETTE 4X48 STRL (DRAPES) ×2 IMPLANT
SUT ETHILON 3 0 PS 1 (SUTURE) IMPLANT
SUT ETHILON 4 0 PS 2 18 (SUTURE) ×1 IMPLANT
SUT MERSILENE 4 0 P 3 (SUTURE) IMPLANT
SUT VIC AB 3-0 PS1 18 (SUTURE)
SUT VIC AB 3-0 PS1 18XBRD (SUTURE) IMPLANT
SUT VICRYL 4-0 PS2 18IN ABS (SUTURE) IMPLANT
SYR BULB 3OZ (MISCELLANEOUS) ×1 IMPLANT
SYR CONTROL 10ML LL (SYRINGE) ×1 IMPLANT
TOWEL OR 17X24 6PK STRL BLUE (TOWEL DISPOSABLE) ×3 IMPLANT
TRAY DSU PREP LF (CUSTOM PROCEDURE TRAY) ×1 IMPLANT
UNDERPAD 30X30 INCONTINENT (UNDERPADS AND DIAPERS) ×2 IMPLANT
WATER STERILE IRR 1000ML POUR (IV SOLUTION) ×1 IMPLANT

## 2013-01-31 NOTE — H&P (Signed)
  Alejandro Lawrence is an 32 y.o. male.   Chief Complaint: right thumb fracture HPI: 32 yo lhd male punched wall 01/17/13 injuring right thumb.  Seen at [facility] where XR revealed right thumb proximal phalanx fracture.  Splinted and followed up in office.  Reports no previous injury to thumb and no other injury at this time.  Past Medical History  Diagnosis Date  . Peptic ulcer   . GERD (gastroesophageal reflux disease)   . Phalanx, proximal fracture of finger 01/17/2013    right thumb    Past Surgical History  Procedure Laterality Date  . Upper gastrointestinal endoscopy      age 54  . Inguinal hernia repair  09/06/2012    Procedure: HERNIA REPAIR INGUINAL ADULT;  Surgeon: Marlane Hatcher, MD;  Location: [facility];  Service: General;  Laterality: Left;    History reviewed. No pertinent family history. Social History:  reports that he has quit smoking. He has never used smokeless tobacco. He reports that  drinks alcohol. He reports that he does not use illicit drugs.  Allergies: No Known Allergies  Medications Prior to Admission  Medication Sig Dispense Refill  . acetaminophen (TYLENOL) 325 MG tablet Take 650 mg by mouth every 6 (six) hours as needed for pain.      Marland Kitchen omeprazole (PRILOSEC) 40 MG capsule Take 40 mg by mouth 2 (two) times daily.      . naproxen (NAPROSYN) 500 MG tablet Take 1 tablet (500 mg total) by mouth 2 (two) times daily with a meal.  30 tablet  0    Results for orders placed during the hospital encounter of 01/31/13 (from the past 48 hour(s))  POCT HEMOGLOBIN-HEMACUE     Status: None   Collection Time    01/31/13 12:30 PM      Result Value Range   Hemoglobin 15.9  13.0 - 17.0 g/dL    No results found.   A comprehensive review of systems was negative except for: Eyes: positive for contacts/glasses  Blood pressure 119/86, pulse 81, temperature 98.2 F (36.8 C), temperature source Oral, resp. rate 18, height 5\' 7"  (1.702 m), weight 68.947 kg (152 lb), SpO2  100.00%.  General appearance: alert, cooperative and appears stated age Head: Normocephalic, without obvious abnormality, atraumatic Neck: supple, symmetrical, trachea midline Resp: clear to auscultation bilaterally Cardio: regular rate and rhythm GI: non tender Extremities: intact sensation and capillary refill all digits.  +epl/fpl/io.  ttp thumb proximal phalanx. Pulses: 2+ and symmetric Skin: ecchymosis of right thumb. Neurologic: Grossly normal Incision/Wound: na  Assessment/Plan Right thumb proximal phalanx fracture.  Recommend OR for CRPP vs ORIF.  Risks, benefits, and alternatives of surgery were discussed and the patient agrees with the plan of care.   Odeal Welden R 01/31/2013, 12:59 PM

## 2013-01-31 NOTE — Op Note (Signed)
[id number] 

## 2013-01-31 NOTE — Anesthesia Postprocedure Evaluation (Signed)
  Anesthesia Post-op Note  Patient: Alejandro Lawrence  Procedure(s) Performed: Procedure(s) with comments: CLOSED REDUCTION METACARPAL WITH PERCUTANEOUS PINNING (Right) - RIGHT THUMB CLOSED REDUCTION PERCUTANEOUS PINNING   Patient Location: PACU  Anesthesia Type:General  Level of Consciousness: awake and alert   Airway and Oxygen Therapy: Patient Spontanous Breathing  Post-op Pain: mild  Post-op Assessment: Post-op Vital signs reviewed, Patient's Cardiovascular Status Stable, Respiratory Function Stable, Patent Airway and No signs of Nausea or vomiting  Post-op Vital Signs: Reviewed and stable  Complications: No apparent anesthesia complications

## 2013-01-31 NOTE — Anesthesia Procedure Notes (Signed)
Procedure Name: LMA Insertion Date/Time: 01/31/2013 1:08 PM Performed by: Gar Gibbon Pre-anesthesia Checklist: Patient identified, Emergency Drugs available, Suction available and Patient being monitored Patient Re-evaluated:Patient Re-evaluated prior to inductionOxygen Delivery Method: Circle System Utilized Preoxygenation: Pre-oxygenation with 100% oxygen Intubation Type: IV induction Ventilation: Mask ventilation without difficulty LMA: LMA inserted LMA Size: 4.0 Number of attempts: 1 Airway Equipment and Method: bite block Placement Confirmation: positive ETCO2 Tube secured with: Tape Dental Injury: Teeth and Oropharynx as per pre-operative assessment

## 2013-01-31 NOTE — Op Note (Signed)
Alejandro Lawrence, Alejandro Lawrence                ACCOUNT NO.:  [id number]  MEDICAL RECORD NO.:  [id number]  LOCATION:                                 FACILITY:  PHYSICIAN:  Betha Loa, MD        DATE OF BIRTH:  Jul 01, 1981  DATE OF PROCEDURE:  01/31/2013 DATE OF DISCHARGE:                              OPERATIVE REPORT   PREOPERATIVE DIAGNOSIS:  Right thumb proximal phalanx fracture.  POSTOPERATIVE DIAGNOSIS:  Right thumb proximal phalanx fracture.  PROCEDURE:  Closed reduction and percutaneous pinning, right thumb proximal phalanx fracture.  SURGEON:  Betha Loa, MD  ASSISTANTS:  None.  ANESTHESIA:  General.  IV FLUIDS:  Per anesthesia flow sheet.  ESTIMATED BLOOD LOSS:  Minimal.  COMPLICATIONS:  None.  SPECIMENS:  None.  TOURNIQUET TIME:  20 minutes.  DISPOSITION:  Stable to PACU.  INDICATIONS:  Mr. Zuercher is a 32 year old left-hand dominant male who punched a wall 2 weeks ago.  He was seen at [facility] where radiographs were taken revealing a right thumb proximal phalanx fracture.  He followed up with me in the office after being splinted.  We reviewed the x-rays.  We discussed nonoperative and operative treatment options.  He elected to proceed with operative fixation.  Risks, benefits, and alternatives of surgery were discussed including the risk of blood loss, infection, damage to nerves, vessels, tendons, ligaments, bone, failure of surgery, need for additional surgery, complications with wound healing, continued pain, nonunion, malunion, stiffness.  He voiced understanding of these risks and elected to proceed.  OPERATIVE COURSE:  After being identified preoperatively by myself, the patient and I agreed upon procedure and site of procedure.  Surgical site was marked.  The risks, benefits, and alternatives of surgery were reviewed and he wished to proceed.  Surgical consent had been signed. He was given 2 g of IV Ancef as preoperative antibiotic  prophylaxis.  He was transferred to the operating room and placed on the operating room table in supine position with the right upper extremity on arm board. General anesthesia was induced by the anesthesiologist.  Right upper extremity was prepped and draped in normal sterile orthopedic fashion. Surgical pause was performed between surgeons, anesthesia, operating room staff, and all were in agreement as to the patient, procedure, and site of procedure.  Tourniquet at the proximal aspect of the extremity was inflated to 250 mmHg after exsanguination of the limb with an Esmarch bandage.  Closed reduction was performed.  C-arm was used in AP, lateral, and oblique projections to ensure appropriate reduction, which was the case.  There was a small butterfly fragment, which would not reduce but it was felt that the reduction would be adequate for healing. Two 0.035-inch K-wires were then advanced from the proximal aspect of the phalanx across the fracture site into the distal aspect of the phalanx.  These were started into the far cortex but not through it.  C- arm was again used in AP, lateral, and oblique projections to ensure appropriate reduction and positioning of hardware which was the case. Normal alignment of the proximal and distal fragments was obtained.  The pins were bent and cut short.  The pin sites were then dressed with sterile Xeroform, 4x4s, and wrapped with a Kerlix bandage.  A thumb spica splint was placed and wrapped with Kerlix and Ace bandage.  The tourniquet was deflated at 20 minutes.  The fingertips were pink with brisk capillary refill after deflation of tourniquet.  Operative drapes were broken down.  The patient was awoken from anesthesia safely.  He was transferred back to the stretcher and taken to the PACU in stable condition.  I will see him back in the office in 1 week for postoperative followup.  I will give him Percocet 5/325 1-2 p.o. q.6 hours p.r.n. pain,  dispensed #30.     Betha Loa, MD     KK/MEDQ  D:  01/31/2013  T:  01/31/2013  Job:  [id number]

## 2013-01-31 NOTE — Transfer of Care (Signed)
Immediate Anesthesia Transfer of Care Note  Patient: Alejandro Lawrence  Procedure(s) Performed: Procedure(s) with comments: CLOSED REDUCTION METACARPAL WITH PERCUTANEOUS PINNING (Right) - RIGHT THUMB CLOSED REDUCTION PERCUTANEOUS PINNING   Patient Location: PACU  Anesthesia Type:General  Level of Consciousness: sedated and patient cooperative  Airway & Oxygen Therapy: Patient Spontanous Breathing and Patient connected to face mask oxygen  Post-op Assessment: Report given to PACU RN and Post -op Vital signs reviewed and stable  Post vital signs: Reviewed and stable  Complications: No apparent anesthesia complications

## 2013-01-31 NOTE — Anesthesia Preprocedure Evaluation (Signed)
Anesthesia Evaluation  Patient identified by MRN, date of birth, ID band Patient awake    Reviewed: Allergy & Precautions, H&P , NPO status , Patient's Chart, lab work & pertinent test results  Airway Mallampati: II TM Distance: >3 FB Neck ROM: Full    Dental no notable dental hx. (+) Teeth Intact, Chipped and Dental Advisory Given   Pulmonary neg pulmonary ROS,  breath sounds clear to auscultation  Pulmonary exam normal       Cardiovascular negative cardio ROS  Rhythm:Regular Rate:Normal     Neuro/Psych negative neurological ROS  negative psych ROS   GI/Hepatic Neg liver ROS, PUD, GERD-  Medicated and Controlled,  Endo/Other  negative endocrine ROS  Renal/GU negative Renal ROS  negative genitourinary   Musculoskeletal   Abdominal   Peds  Hematology negative hematology ROS (+)   Anesthesia Other Findings   Reproductive/Obstetrics negative OB ROS                           Anesthesia Physical Anesthesia Plan  ASA: II  Anesthesia Plan: General   Post-op Pain Management:    Induction: Intravenous  Airway Management Planned: LMA  Additional Equipment:   Intra-op Plan:   Post-operative Plan: Extubation in OR  Informed Consent: I have reviewed the patients History and Physical, chart, labs and discussed the procedure including the risks, benefits and alternatives for the proposed anesthesia with the patient or authorized representative who has indicated his/her understanding and acceptance.   Dental advisory given  Plan Discussed with: CRNA  Anesthesia Plan Comments:         Anesthesia Quick Evaluation

## 2013-01-31 NOTE — Brief Op Note (Signed)
01/31/2013  1:47 PM  PATIENT:  Alejandro Lawrence  32 y.o. male  PRE-OPERATIVE DIAGNOSIS:  RIGHT THUMB PROXIMAL PHALANX FRACTURE  POST-OPERATIVE DIAGNOSIS:  RIGHT THUMB PROXIMAL PHALANX FRACTURE  PROCEDURE:  Procedure(s) with comments: CLOSED REDUCTION METACARPAL WITH PERCUTANEOUS PINNING (Right) - RIGHT THUMB CLOSED REDUCTION PERCUTANEOUS PINNING VS ORIF  SURGEON:  Surgeon(s) and Role:    * Tami Ribas, MD - Primary  PHYSICIAN ASSISTANT:   ASSISTANTS: none   ANESTHESIA:   general  EBL:     BLOOD ADMINISTERED:none  DRAINS: none   LOCAL MEDICATIONS USED:  MARCAINE     SPECIMEN:  No Specimen  DISPOSITION OF SPECIMEN:  N/A  COUNTS:  YES  TOURNIQUET:   Total Tourniquet Time Documented: Upper Arm (Right) - 20 minutes Total: Upper Arm (Right) - 20 minutes   DICTATION: .Other Dictation: Dictation Number [id number]  PLAN OF CARE: Discharge to home after PACU  PATIENT DISPOSITION:  PACU - hemodynamically stable.

## 2013-02-01 ENCOUNTER — Encounter (HOSPITAL_BASED_OUTPATIENT_CLINIC_OR_DEPARTMENT_OTHER): Payer: Self-pay | Admitting: Orthopedic Surgery

## 2013-02-01 ENCOUNTER — Ambulatory Visit: Payer: Self-pay | Admitting: Occupational Therapy

## 2013-02-04 ENCOUNTER — Ambulatory Visit: Payer: Self-pay | Attending: Orthopedic Surgery | Admitting: *Deleted

## 2013-02-04 DIAGNOSIS — R609 Edema, unspecified: Secondary | ICD-10-CM | POA: Insufficient documentation

## 2013-02-04 DIAGNOSIS — M25649 Stiffness of unspecified hand, not elsewhere classified: Secondary | ICD-10-CM | POA: Insufficient documentation

## 2013-02-04 DIAGNOSIS — M25549 Pain in joints of unspecified hand: Secondary | ICD-10-CM | POA: Insufficient documentation

## 2013-02-04 DIAGNOSIS — IMO0001 Reserved for inherently not codable concepts without codable children: Secondary | ICD-10-CM | POA: Insufficient documentation

## 2013-02-07 ENCOUNTER — Ambulatory Visit: Payer: Self-pay | Admitting: Occupational Therapy

## 2015-03-06 ENCOUNTER — Encounter (HOSPITAL_COMMUNITY): Payer: Self-pay

## 2015-03-06 ENCOUNTER — Emergency Department (HOSPITAL_COMMUNITY)
Admission: EM | Admit: 2015-03-06 | Discharge: 2015-03-06 | Disposition: A | Discharge status: Home / Self Care | Payer: Self-pay | Attending: Emergency Medicine | Admitting: Emergency Medicine

## 2015-03-06 DIAGNOSIS — W57XXXA Bitten or stung by nonvenomous insect and other nonvenomous arthropods, initial encounter: Secondary | ICD-10-CM | POA: Insufficient documentation

## 2015-03-06 DIAGNOSIS — Z87891 Personal history of nicotine dependence: Secondary | ICD-10-CM | POA: Insufficient documentation

## 2015-03-06 DIAGNOSIS — Z8711 Personal history of peptic ulcer disease: Secondary | ICD-10-CM | POA: Insufficient documentation

## 2015-03-06 DIAGNOSIS — K219 Gastro-esophageal reflux disease without esophagitis: Secondary | ICD-10-CM | POA: Insufficient documentation

## 2015-03-06 DIAGNOSIS — S30861A Insect bite (nonvenomous) of abdominal wall, initial encounter: Secondary | ICD-10-CM | POA: Insufficient documentation

## 2015-03-06 DIAGNOSIS — Y9389 Activity, other specified: Secondary | ICD-10-CM | POA: Insufficient documentation

## 2015-03-06 DIAGNOSIS — Y9289 Other specified places as the place of occurrence of the external cause: Secondary | ICD-10-CM | POA: Insufficient documentation

## 2015-03-06 DIAGNOSIS — Z791 Long term (current) use of non-steroidal anti-inflammatories (NSAID): Secondary | ICD-10-CM | POA: Insufficient documentation

## 2015-03-06 DIAGNOSIS — R51 Headache: Secondary | ICD-10-CM | POA: Insufficient documentation

## 2015-03-06 DIAGNOSIS — Z79899 Other long term (current) drug therapy: Secondary | ICD-10-CM | POA: Insufficient documentation

## 2015-03-06 DIAGNOSIS — Y998 Other external cause status: Secondary | ICD-10-CM | POA: Insufficient documentation

## 2015-03-06 MED ORDER — DOXYCYCLINE HYCLATE 100 MG PO CAPS: 100.0000 mg | ORAL_CAPSULE | Freq: Two times a day (BID) | ORAL | Status: AC

## 2015-03-06 NOTE — ED Notes (Addendum)
Found a tick on him Wednesday and since has felt like he had the flu. Had a rash on the left leg and then it went away. Had body aches and muscle aches. Eyes have been watering, nose has been running. Has felt warm but not thinking he was running a fever. Tick bite itches. Has been having headaches. Light and noises have been bothering him.

## 2015-03-06 NOTE — Discharge Instructions (Signed)
Tick Bite Information Ticks are insects that attach themselves to the skin and draw blood for food. There are various types of ticks. Common types include wood ticks and deer ticks. Most ticks live in shrubs and grassy areas. Ticks can climb onto your body when you make contact with leaves or grass where the tick is waiting. The most common places on the body for ticks to attach themselves are the scalp, neck, armpits, waist, and groin. Most tick bites are harmless, but sometimes ticks carry germs that cause diseases. These germs can be spread to a person during the tick's feeding process. The chance of a disease spreading through a tick bite depends on:   The type of tick.  Time of year.   How long the tick is attached.   Geographic location.  HOW CAN YOU PREVENT TICK BITES? Take these steps to help prevent tick bites when you are outdoors:  Wear protective clothing. Long sleeves and long pants are best.   Wear white clothes so you can see ticks more easily.  Tuck your pant legs into your socks.   If walking on a trail, stay in the middle of the trail to avoid brushing against bushes.  Avoid walking through areas with long grass.  Put insect repellent on all exposed skin and along boot tops, pant legs, and sleeve cuffs.   Check clothing, hair, and skin repeatedly and before going inside.   Brush off any ticks that are not attached.  Take a shower or bath as soon as possible after being outdoors.  WHAT IS THE PROPER WAY TO REMOVE A TICK? Ticks should be removed as soon as possible to help prevent diseases caused by tick bites. 1. If latex gloves are available, put them on before trying to remove a tick.  2. Using fine-point tweezers, grasp the tick as close to the skin as possible. You may also use curved forceps or a tick removal tool. Grasp the tick as close to its head as possible. Avoid grasping the tick on its body. 3. Pull gently with steady upward pressure until  the tick lets go. Do not twist the tick or jerk it suddenly. This may break off the tick's head or mouth parts. 4. Do not squeeze or crush the tick's body. This could force disease-carrying fluids from the tick into your body.  5. After the tick is removed, wash the bite area and your hands with soap and water or other disinfectant such as alcohol. 6. Apply a small amount of antiseptic cream or ointment to the bite site.  7. Wash and disinfect any instruments that were used.  Do not try to remove a tick by applying a hot match, petroleum jelly, or fingernail polish to the tick. These methods do not work and may increase the chances of disease being spread from the tick bite.  WHEN SHOULD YOU SEEK MEDICAL CARE? Contact your health care provider if you are unable to remove a tick from your skin or if a part of the tick breaks off and is stuck in the skin.  After a tick bite, you need to be aware of signs and symptoms that could be related to diseases spread by ticks. Contact your health care provider if you develop any of the following in the days or weeks after the tick bite:  Unexplained fever.  Rash. A circular rash that appears days or weeks after the tick bite may indicate the possibility of Lyme disease. The rash may resemble   a target with a bull's-eye and may occur at a different part of your body than the tick bite.  Redness and swelling in the area of the tick bite.   Tender, swollen lymph glands.   Diarrhea.   Weight loss.   Cough.   Fatigue.   Muscle, joint, or bone pain.   Abdominal pain.   Headache.   Lethargy or a change in your level of consciousness.  Difficulty walking or moving your legs.   Numbness in the legs.   Paralysis.  Shortness of breath.   Confusion.   Repeated vomiting.  Document Released: 11/28/2000 Document Revised: 09/21/2013 Document Reviewed: 05/11/2013 [employer] Patient Information 2015 [employer]. This information is  not intended to replace advice given to you by your health care provider. Make sure you discuss any questions you have with your health care provider.  

## 2015-03-06 NOTE — ED Provider Notes (Signed)
CSN: [id number]     Arrival date & time 03/06/15  1721 History   First MD Initiated Contact with Patient 03/06/15 1926     Chief Complaint  Patient presents with  . Generalized Body Aches  . Insect Bite    tick bite     (Consider location/radiation/quality/duration/timing/severity/associated sxs/prior Treatment) Patient is a 34 y.o. male presenting with headaches. The history is provided by the patient. No language interpreter was used.  Headache Pain location:  Generalized Radiates to:  Does not radiate Onset quality:  Gradual Timing:  Constant Progression:  Worsening Chronicity:  New Similar to prior headaches: no   Context: not activity   Relieved by:  Nothing Worsened by:  Nothing Ineffective treatments:  None tried Associated symptoms: no tingling   Pt removed a tick from his chest last Wednesday.  Pt reports he has been sick since Saturday.  Pt has had body aches, rash and a headache.  Pt has been reading about tick diseases on the internet.  Past Medical History  Diagnosis Date  . Peptic ulcer   . GERD (gastroesophageal reflux disease)   . Phalanx, proximal fracture of finger 01/17/2013    right thumb   Past Surgical History  Procedure Laterality Date  . Upper gastrointestinal endoscopy      age 34  . Inguinal hernia repair  09/06/2012    Procedure: HERNIA REPAIR INGUINAL ADULT;  Surgeon: Marlane HatcherWilliam S Bradford, MD;  Location: [facility];  Service: General;  Laterality: Left;  . Closed reduction metacarpal with percutaneous pinning Right 01/31/2013    Procedure: CLOSED REDUCTION METACARPAL WITH PERCUTANEOUS PINNING;  Surgeon: Tami RibasKevin R Kuzma, MD;  Location: [facility];  Service: Orthopedics;  Laterality: Right;  RIGHT THUMB CLOSED REDUCTION PERCUTANEOUS PINNING    No family history on file. History  Substance Use Topics  . Smoking status: Former Games developermoker  . Smokeless tobacco: Never Used     Comment: quit smoking 2011  . Alcohol Use: Yes     Comment:  occasionally    Review of Systems  Neurological: Positive for headaches.  All other systems reviewed and are negative.     Allergies  Sulfa antibiotics  Home Medications   Prior to Admission medications   Medication Sig Start Date End Date Taking? Authorizing Provider  ibuprofen (ADVIL,MOTRIN) 200 MG tablet Take 400 mg by mouth every 6 (six) hours as needed for mild pain or moderate pain.   Yes Historical Provider, MD  omeprazole (PRILOSEC) 40 MG capsule Take 40 mg by mouth 2 (two) times daily.   Yes Historical Provider, MD  Triprolidine-Pseudoephedrine (ANTIHISTAMINE PO) Take 1 tablet by mouth daily as needed (allergies/itchiny).   Yes Historical Provider, MD  acetaminophen (TYLENOL) 325 MG tablet Take 650 mg by mouth every 6 (six) hours as needed for pain.    Historical Provider, MD  naproxen (NAPROSYN) 500 MG tablet Take 1 tablet (500 mg total) by mouth 2 (two) times daily with a meal. Patient not taking: Reported on 03/06/2015 01/18/13   Severiano Gilbertammi Triplett, PA-C  oxyCODONE-acetaminophen (PERCOCET) 5-325 MG per tablet 1-2 tabs po q6 hours prn pain Patient not taking: Reported on 03/06/2015 01/31/13   Betha LoaKevin Kuzma, MD   BP 141/81 mmHg  Pulse 82  Temp(Src) 97.9 F (36.6 C) (Oral)  Resp 20  Ht 5\' 7"  (1.702 m)  Wt 155 lb (70.308 kg)  BMI 24.27 kg/m2  SpO2 98% Physical Exam  Constitutional: He is oriented to person, place, and time. He appears well-developed.  HENT:  Head: Normocephalic.  Right Ear: External ear normal.  Left Ear: External ear normal.  Mouth/Throat: Oropharynx is clear and moist.  Eyes: Conjunctivae and EOM are normal. Pupils are equal, round, and reactive to light.  Neck: Normal range of motion. Neck supple.  Cardiovascular: Normal rate and normal heart sounds.   Pulmonary/Chest: Effort normal and breath sounds normal.  Abdominal: Soft.  Musculoskeletal: Normal range of motion.  Neurological: He is alert and oriented to person, place, and time. He has normal  reflexes.  Skin: Skin is warm.  Psychiatric: He has a normal mood and affect.  Nursing note and vitals reviewed.   ED Course  Procedures (including critical care time) Labs Review Labs Reviewed - No data to display  Imaging Review No results found.   EKG Interpretation None      MDM rmsf and lyme pending.  Pt advised he needs to see Dr. Sudie Bailey for recheck in 2 days.  Pt given rx for doxycycline for 14 days.     Final diagnoses:  Tick bite of abdomen, initial encounter    Doxycycline AVS   Elson Areas, PA-C 03/06/15 2010  [address] [place], [place] 03/06/15 2056  Samuel Jester, DO 03/08/15 1759

## 2015-03-08 LAB — B. BURGDORFI ANTIBODIES

## 2015-03-09 LAB — ROCKY MTN SPOTTED FVR ABS PNL(IGG+IGM)
RMSF IGG: NEGATIVE
RMSF IGM: 0.33 {index} (ref 0.00–0.89)

## 2015-03-15 ENCOUNTER — Telehealth (HOSPITAL_BASED_OUTPATIENT_CLINIC_OR_DEPARTMENT_OTHER): Payer: Self-pay | Admitting: Emergency Medicine

## 2020-05-28 ENCOUNTER — Encounter (HOSPITAL_COMMUNITY): Payer: Self-pay | Admitting: Emergency Medicine

## 2020-05-28 ENCOUNTER — Other Ambulatory Visit: Payer: Self-pay

## 2020-05-28 ENCOUNTER — Emergency Department (HOSPITAL_COMMUNITY)
Admission: EM | Admit: 2020-05-28 | Discharge: 2020-05-29 | Disposition: A | Discharge status: Home / Self Care | Payer: BC Managed Care – PPO | Attending: Emergency Medicine | Admitting: Emergency Medicine

## 2020-05-28 ENCOUNTER — Emergency Department (HOSPITAL_COMMUNITY): Payer: BC Managed Care – PPO

## 2020-05-28 DIAGNOSIS — R200 Anesthesia of skin: Secondary | ICD-10-CM

## 2020-05-28 DIAGNOSIS — R29818 Other symptoms and signs involving the nervous system: Secondary | ICD-10-CM | POA: Insufficient documentation

## 2020-05-28 DIAGNOSIS — Z79899 Other long term (current) drug therapy: Secondary | ICD-10-CM | POA: Diagnosis not present

## 2020-05-28 DIAGNOSIS — R519 Headache, unspecified: Secondary | ICD-10-CM | POA: Insufficient documentation

## 2020-05-28 DIAGNOSIS — Z87891 Personal history of nicotine dependence: Secondary | ICD-10-CM | POA: Insufficient documentation

## 2020-05-28 DIAGNOSIS — I498 Other specified cardiac arrhythmias: Secondary | ICD-10-CM | POA: Insufficient documentation

## 2020-05-28 LAB — CBC
HCT: 47 % (ref 39.0–52.0)
Hemoglobin: 15.2 g/dL (ref 13.0–17.0)
MCH: 29 pg (ref 26.0–34.0)
MCHC: 32.3 g/dL (ref 30.0–36.0)
MCV: 89.5 fL (ref 80.0–100.0)
Platelets: 426 10*3/uL — ABNORMAL HIGH (ref 150–400)
RBC: 5.25 MIL/uL (ref 4.22–5.81)
RDW: 12.2 % (ref 11.5–15.5)
WBC: 8.8 10*3/uL (ref 4.0–10.5)
nRBC: 0 % (ref 0.0–0.2)

## 2020-05-28 LAB — BASIC METABOLIC PANEL
Anion gap: 8 (ref 5–15)
BUN: 21 mg/dL — ABNORMAL HIGH (ref 6–20)
CO2: 26 mmol/L (ref 22–32)
Calcium: 9.1 mg/dL (ref 8.9–10.3)
Chloride: 102 mmol/L (ref 98–111)
Creatinine, Ser: 1.01 mg/dL (ref 0.61–1.24)
GFR calc Af Amer: >60 mL/min (ref >60)
GFR calc non Af Amer: >60 mL/min (ref >60)
Glucose, Bld: 104 mg/dL — ABNORMAL HIGH (ref 70–99)
Potassium: 4.2 mmol/L (ref 3.5–5.1)
Sodium: 136 mmol/L (ref 135–145)

## 2020-05-28 LAB — TROPONIN I (HIGH SENSITIVITY)
Troponin I (High Sensitivity): <2 ng/L (ref <18)
Troponin I (High Sensitivity): 2 ng/L (ref <18)

## 2020-05-28 MED ORDER — SODIUM CHLORIDE 0.9% FLUSH: 3.0000 mL | Freq: Once | INTRAVENOUS | Status: DC

## 2020-05-28 NOTE — ED Triage Notes (Signed)
Pt c/o left shoulder pain, chest pain, and dizziness that started x 2 days ago. Pt also reports that his left arm went numb on Saturday, lasting and resolved.

## 2020-05-29 MED ORDER — DIPHENHYDRAMINE HCL 50 MG/ML IJ SOLN
12.5000 mg | Freq: Once | INTRAMUSCULAR | Status: DC
  Filled 2020-05-29: qty 1

## 2020-05-29 MED ORDER — PROCHLORPERAZINE EDISYLATE 10 MG/2ML IJ SOLN
10.0000 mg | Freq: Once | INTRAMUSCULAR | Status: DC
  Filled 2020-05-29: qty 2

## 2020-05-29 MED ORDER — SODIUM CHLORIDE 0.9 % IV BOLUS: 1000.0000 mL | Freq: Once | INTRAVENOUS | Status: DC

## 2020-05-29 NOTE — ED Notes (Signed)
Went to give pt ordered meds, and pt not in room. Already left for an 0830 appt w/his doc. Provider and charge RN aware

## 2020-05-29 NOTE — ED Provider Notes (Signed)
[facility] EMERGENCY DEPARTMENT Provider Note   CSN: [id number] Arrival date & time: 05/28/20  1851     History Chief Complaint  Patient presents with  . Chest Pain    Alejandro Lawrence is a 39 y.o. male with a past medical history significant for GERD and peptic ulcer disease who presents to the ED for multiple complaints. First, patient admits to 4-5 minutes of left arm numbness/tingling on Saturday. He notes numbness/tingling went from his neck and radiated down to his hand. Unsure if all his fingers were numb. Denies recent neck injury. He notes his neck "hasn't been right" in numerous years. Denies associated weakness. No numbness/tingling since Saturday. He also admits to left shoulder issues. He has dislocated his left shoulder twice. Denies associated neck and left shoulder pain.   Patient also admits to bilateral parietal lobe headache for the past 24 hours. Headache is throbbing in nature. Denies sudden onset and worse intensity at onset. Patient notes headache has been constant. He has not tried anything for his symptoms. Denies associated vision changes, nausea, vomiting, speech changes, dizziness, facial droop, and unilateral weakness. He admits to having a headache 1-2 times a year, but notes this feels different in that it is a throbbing sensation. Denies fever and chills. Denies head injury. He is not currently on any blood thinners.  Patient also admits intermittent episodes of a flutter feeling in his chest. Denies chest pain. Notes that flutters feel like a skipped beat. Denies history of blood clots, recent surgeries, recent long immobilizations, and hormonal treatments.  Denies associated lower extremity edema. Denies shortness of breath. He notes his "heartburn" has been worse over the past few days which he is currently taking medication for with mild relief.   History obtained from patient and past medical records. No interpreter used during encounter.       Past Medical History:  Diagnosis Date  . GERD (gastroesophageal reflux disease)   . Peptic ulcer   . Phalanx, proximal fracture of finger 01/17/2013   right thumb    There are no problems to display for this patient.   Past Surgical History:  Procedure Laterality Date  . CLOSED REDUCTION METACARPAL WITH PERCUTANEOUS PINNING Right 01/31/2013   Procedure: CLOSED REDUCTION METACARPAL WITH PERCUTANEOUS PINNING;  Surgeon: Tami Ribas, MD;  Location: [facility];  Service: Orthopedics;  Laterality: Right;  RIGHT THUMB CLOSED REDUCTION PERCUTANEOUS PINNING   . INGUINAL HERNIA REPAIR  09/06/2012   Procedure: HERNIA REPAIR INGUINAL ADULT;  Surgeon: Marlane Hatcher, MD;  Location: [facility];  Service: General;  Laterality: Left;  . UPPER GASTROINTESTINAL ENDOSCOPY     age 67       No family history on file.  Social History   Tobacco Use  . Smoking status: Former Games developer  . Smokeless tobacco: Never Used  . Tobacco comment: quit smoking 2011  Substance Use Topics  . Alcohol use: Yes    Comment: occasionally  . Drug use: No    Home Medications Prior to Admission medications   Medication Sig Start Date End Date Taking? Authorizing Provider  acetaminophen (TYLENOL) 325 MG tablet Take 650 mg by mouth every 6 (six) hours as needed for pain.    [provider]  doxycycline (VIBRAMYCIN) 100 MG capsule Take 1 capsule (100 mg total) by mouth 2 (two) times daily. 03/06/15   Elson Areas, PA-C  ibuprofen (ADVIL,MOTRIN) 200 MG tablet Take 400 mg by mouth every 6 (six) hours  as needed for mild pain or moderate pain.    [provider]  naproxen (NAPROSYN) 500 MG tablet Take 1 tablet (500 mg total) by mouth 2 (two) times daily with a meal. Patient not taking: Reported on 03/06/2015 01/18/13   Triplett, Tammy, PA-C  omeprazole (PRILOSEC) 40 MG capsule Take 40 mg by mouth 2 (two) times daily.    [provider]  oxyCODONE-acetaminophen (PERCOCET) 5-325 MG  per tablet 1-2 tabs po q6 hours prn pain Patient not taking: Reported on 03/06/2015 01/31/13   Betha Loa, MD  Triprolidine-Pseudoephedrine (ANTIHISTAMINE PO) Take 1 tablet by mouth daily as needed (allergies/itchiny).    [provider]    Allergies    Sulfa antibiotics  Review of Systems   Review of Systems  Constitutional: Positive for fatigue. Negative for chills and fever.  Respiratory: Negative for shortness of breath.   Cardiovascular: Positive for palpitations (skipped beat). Negative for chest pain.  Gastrointestinal: Negative for abdominal pain, diarrhea, nausea and vomiting.  Neurological: Positive for headaches. Negative for dizziness, facial asymmetry, speech difficulty and weakness.  All other systems reviewed and are negative.   Physical Exam Updated Vital Signs BP (!) 107/93   Pulse 79   Temp 98.7 F (37.1 C) (Oral)   Resp 15   SpO2 99%   Physical Exam Vitals and nursing note reviewed.  Constitutional:      General: He is not in acute distress.    Appearance: He is not ill-appearing.  HENT:     Head: Normocephalic.  Eyes:     Pupils: Pupils are equal, round, and reactive to light.  Neck:     Comments: No cervical midline tenderness. Full ROM of neck  Cardiovascular:     Rate and Rhythm: Normal rate and regular rhythm.     Pulses: Normal pulses.     Heart sounds: Normal heart sounds. No murmur heard.  No friction rub. No gallop.   Pulmonary:     Effort: Pulmonary effort is normal.     Breath sounds: Normal breath sounds.  Abdominal:     General: Abdomen is flat. There is no distension.     Palpations: Abdomen is soft.     Tenderness: There is no abdominal tenderness. There is no guarding or rebound.  Musculoskeletal:     Cervical back: Neck supple.     Comments: Able to move all 4 extremities without difficulty. No lower extremity edema. No left shoulder tenderness. Full ROM of left shoulder. LUE neurovascularly intact.   Skin:    General:  Skin is warm and dry.  Neurological:     General: No focal deficit present.     Mental Status: He is alert.     Comments: Speech is clear, able to follow commands CN III-XII intact Normal strength in upper and lower extremities bilaterally including dorsiflexion and plantar flexion, strong and equal grip strength Sensation grossly intact throughout Moves extremities without ataxia, coordination intact No pronator drift Ambulates without difficulty  Psychiatric:        Mood and Affect: Mood normal.        Behavior: Behavior normal.     ED Results / Procedures / Treatments   Labs (all labs ordered are listed, but only abnormal results are displayed) Labs Reviewed  BASIC METABOLIC PANEL - Abnormal; Notable for the following components:      Result Value   Glucose, Bld 104 (*)    BUN 21 (*)    All other components within normal limits  CBC - Abnormal; Notable for the following components:   Platelets 426 (*)    All other components within normal limits  TROPONIN I (HIGH SENSITIVITY)  TROPONIN I (HIGH SENSITIVITY)    EKG EKG Interpretation  Date/Time:  Monday May 28 2020 18:58:07 EDT Ventricular Rate:  86 PR Interval:  152 QRS Duration: 100 QT Interval:  360 QTC Calculation: 430 R Axis:   93 Text Interpretation: Normal sinus rhythm Rightward axis Borderline ECG No old tracing to compare Confirmed by Drema Pry [id number]) on 05/29/2020 6:57:04 AM   Radiology DG Chest 2 View  Result Date: 05/28/2020 CLINICAL DATA:  Chest pain EXAM: CHEST - 2 VIEW COMPARISON:  Report 10/16/2017 FINDINGS: The heart size and mediastinal contours are within normal limits. Both lungs are clear. The visualized skeletal structures are unremarkable. Rounded left upper lung round opacity appears to correspond to skin artifact on lateral view. IMPRESSION: No active cardiopulmonary disease. Electronically Signed   By: Jasmine Pang M.D.   On: 05/28/2020 19:22    Procedures Procedures (including  critical care time)  Medications Ordered in ED Medications  sodium chloride flush (NS) 0.9 % injection 3 mL (has no administration in time range)  diphenhydrAMINE (BENADRYL) injection 12.5 mg (has no administration in time range)  prochlorperazine (COMPAZINE) injection 10 mg (has no administration in time range)  sodium chloride 0.9 % bolus 1,000 mL (has no administration in time range)    ED Course  I have reviewed the triage vital signs and the nursing notes.  Pertinent labs & imaging results that were available during my care of the patient were reviewed by me and considered in my medical decision making (see chart for details).    MDM Rules/Calculators/A&P                         39 year old male presents to the ED due to numerous complaints of chest flutters, bilateral headache, and left arm numbness and tingling.  Patient had been here over 12 hours prior to my initial evaluation.  At bedside, blood pressure 130/80.  Patient notes his main complaint is his bilateral headache which is not resolved over the past 24 hours and concern for his heart.  Besides blood pressure, vitals all within normal limits.  Patient in no acute distress and non-ill-appearing.  Physical exam reassuring.  No cervical, thoracic, or lumbar midline tenderness.  No tenderness of left shoulder.  Full range of motion of left shoulder and neck.  No lower extremity edema.  Normal neurological exam.  Left upper extremity neurovascularly intact with equal grip strength.  Cardiac labs ordered at triage and personally reviewed.  Will give IV fluids, Benadryl, and Compazine and reassess patient for symptomatic relief of his headache.  PERC negative and low risk using Wells criteria.  Doubt PE/DVT.  Reviewed cardiac monitor in the room which demonstrates normal sinus rhythm with no abnormal rhythm. Suspect heart flutters related to possible PVCs vs. Anxiety. Patient does admits to feeling some anxiety intermittently.  Delta  troponin flat.  Doubt ACS.  CBC reassuring with no leukocytosis and normal hemoglobin.  Mild elevation in platelets at 426.  BMP reassuring with no major electrolyte derangements.  Chest x-ray personally reviewed which is negative for signs of pneumonia, pneumothorax, widened mediastinum.  EKG personally reviewed which demonstrates normal sinus rhythm with no signs of acute ischemia.  7:25 AM shared decision making in regards to CT scan of head. Patient deferred CT scan at this time  giving he has an appointment at 8:30. Discussed that headache medication can cause drowsiness and patient deferred medication at this time. Low suspicion for [facility] or other emergent etiologies of his headache. Patient notes he prefers to take a Tylenol when he leaves here to help with his headache. Suspect numbness/tingling of left arm due to cervical radiculopathy or pinched nerve in the shoulder. No weakness to suggest cord compression. Advised patient to follow-up with PCP this week for further evaluation. Strict ED precautions discussed with patient. Patient states understanding and agrees to plan. Patient discharged home in no acute distress and stable vitals  Final Clinical Impression(s) / ED Diagnoses Final diagnoses:  Nonintractable headache, unspecified chronicity pattern, unspecified headache type  Fluttering heart  Left arm numbness    Rx / DC Orders ED Discharge Orders    None       Karie Kirks 05/29/20 1683    Tegeler, Gwenyth Allegra, MD 05/29/20 0745

## 2020-05-29 NOTE — Discharge Instructions (Addendum)
As discussed, all of your labs were reassuring today. There were no signs of heart damage in your labs today. Please follow-up with your PCP this week for further evaluation of your symptoms. Take tylenol as needed for your headache. I suspect your numbness/tingling could be a pinched nerve from your neck or shoulder. If you continue to have it, you may need an MRI to check for a pinched nerve. Return to the ER for new or worsening symptoms.   Make sure to have your blood pressure checked at your PCP this week since it was elevated while you were here in the ER.

## 2020-07-10 ENCOUNTER — Ambulatory Visit: Payer: BC Managed Care – PPO | Admitting: Orthopaedic Surgery

## 2020-07-23 ENCOUNTER — Other Ambulatory Visit: Payer: Self-pay | Admitting: Orthopaedic Surgery

## 2020-07-23 DIAGNOSIS — M25561 Pain in right knee: Secondary | ICD-10-CM

## 2020-08-13 ENCOUNTER — Other Ambulatory Visit: Payer: BC Managed Care – PPO

## 2021-03-05 IMAGING — DX DG CHEST 2V
2 series · 2 of 2 positions shown · non-contrast
Comparison: Report 10/16/2017

CLINICAL DATA: Chest pain

EXAM:
CHEST - 2 VIEW

[chest pa]
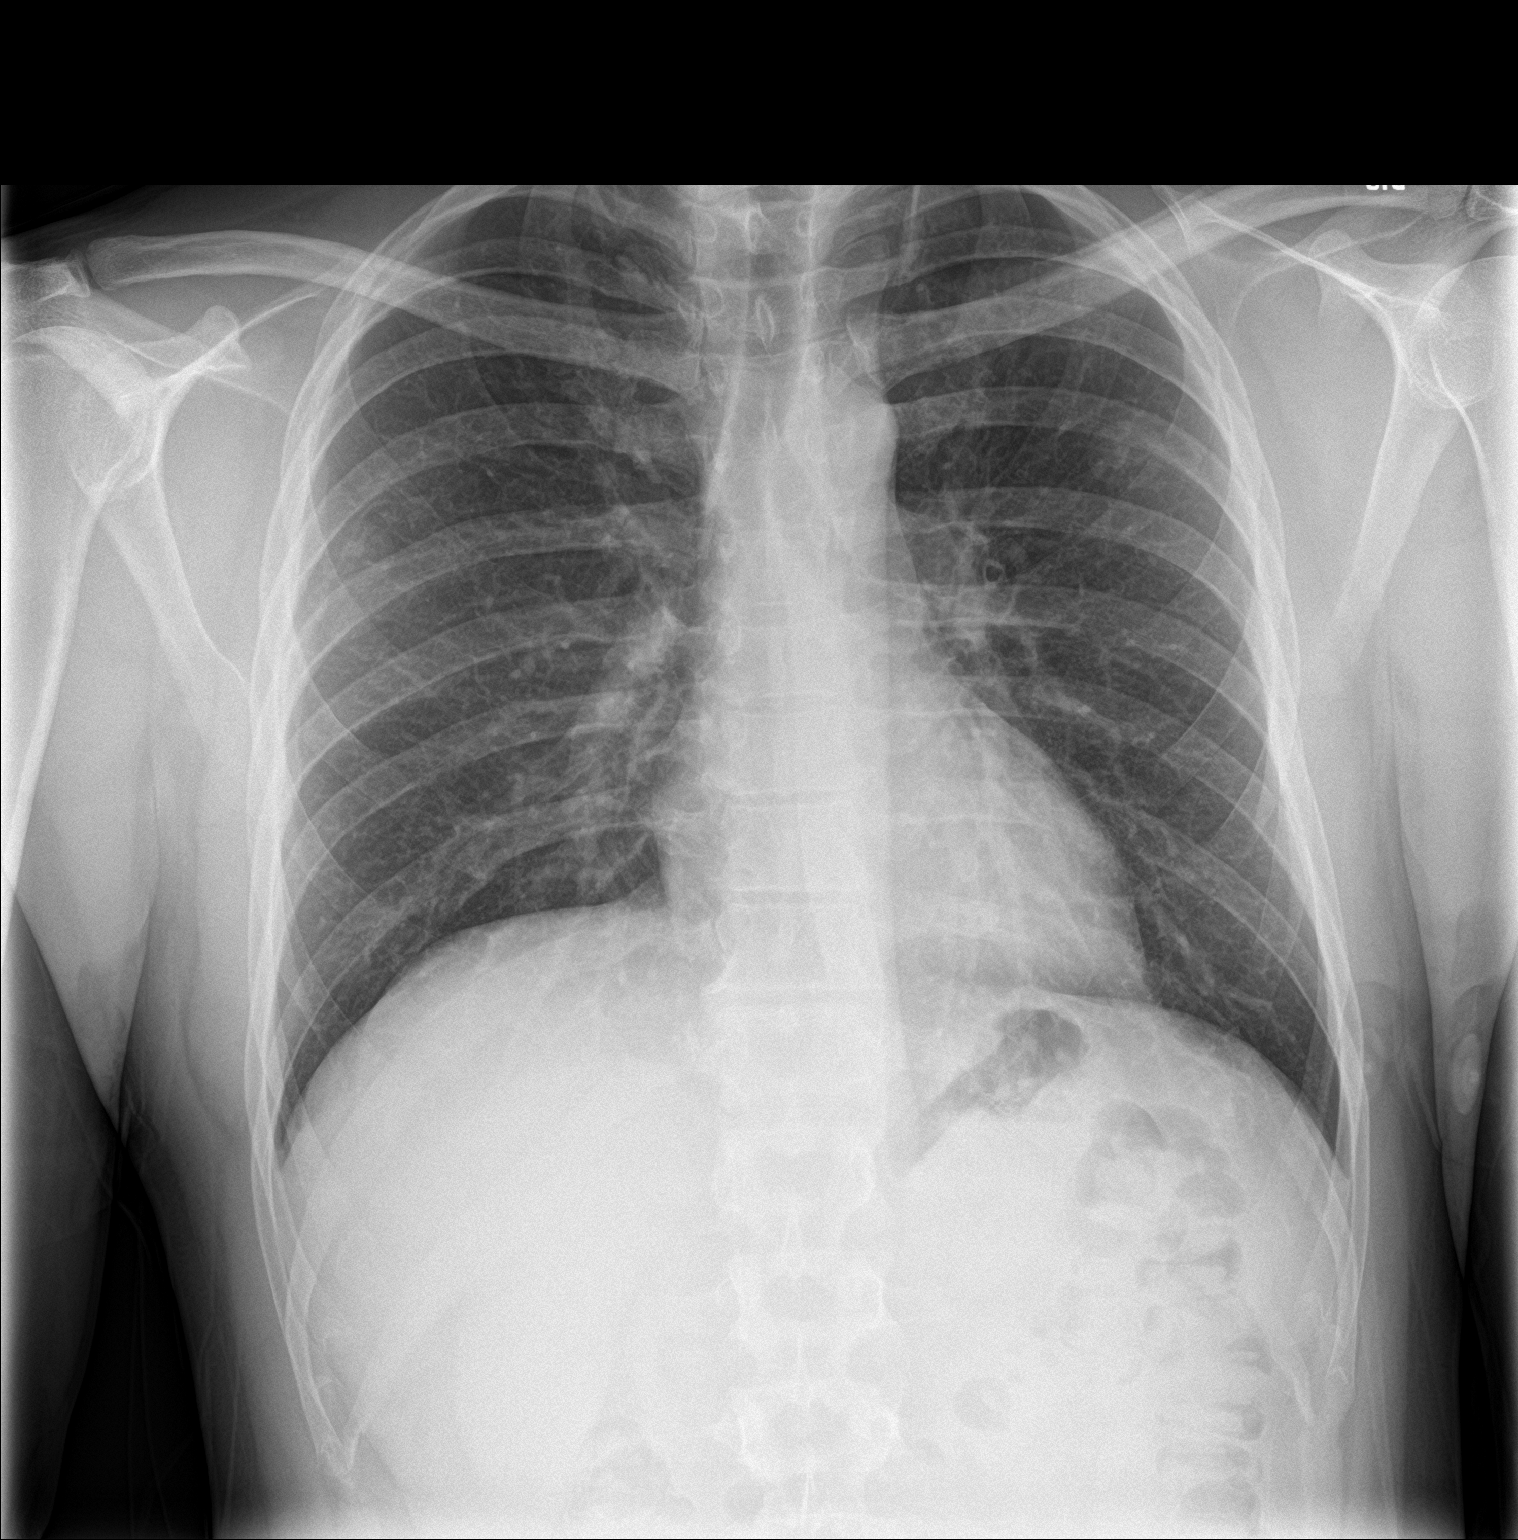

[chest lat]
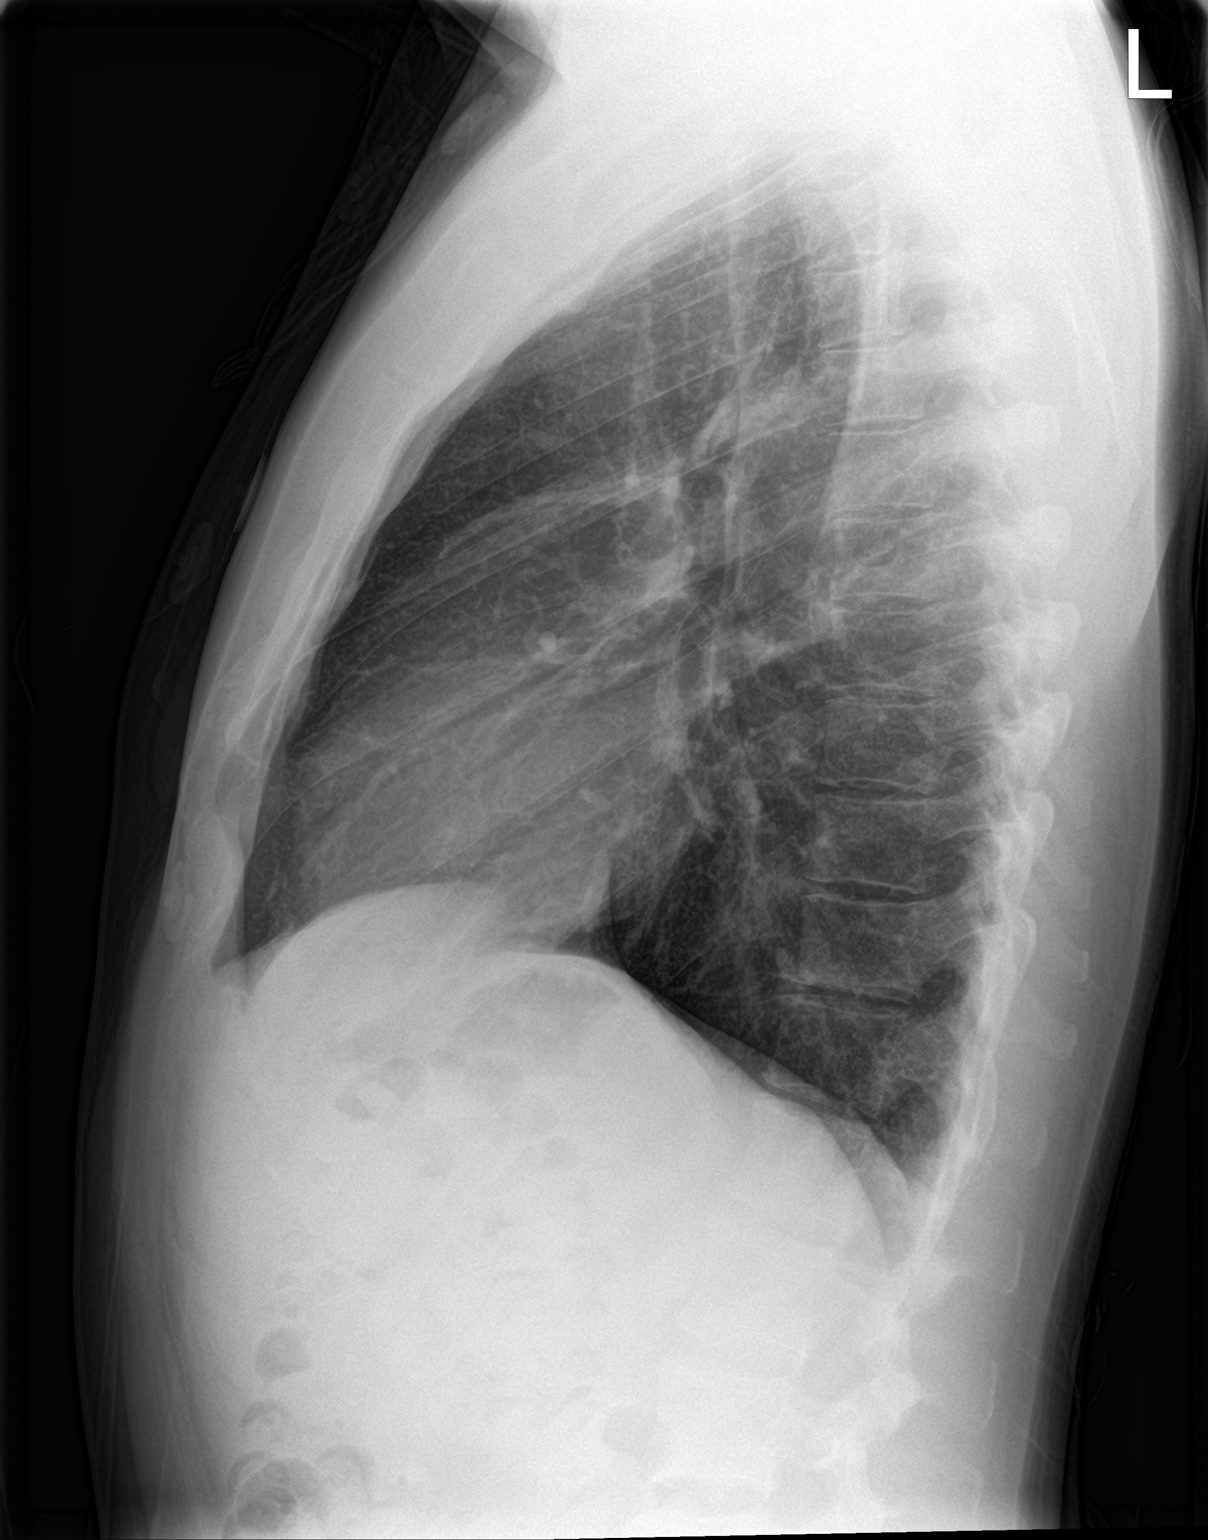

[2 of 2 positions shown; findings below may reference images not displayed]

FINDINGS: The heart size and mediastinal contours are within normal limits.
Both lungs are clear. The visualized skeletal structures are
unremarkable. Rounded left upper lung round opacity appears to
correspond to skin artifact on lateral view.
IMPRESSION: No active cardiopulmonary disease.
